# Patient Record
Sex: Female | Born: 1945 | Race: White | Hispanic: No | Marital: Married | Smoking: Never smoker
Health system: Southern US, Community
[De-identification: ages and names within clinical notes are randomized; demographics above are authoritative.]

## PROBLEM LIST (undated history)

## (undated) DIAGNOSIS — Z87442 Personal history of urinary calculi: Secondary | ICD-10-CM

## (undated) DIAGNOSIS — M1711 Unilateral primary osteoarthritis, right knee: Secondary | ICD-10-CM

## (undated) DIAGNOSIS — E039 Hypothyroidism, unspecified: Secondary | ICD-10-CM

## (undated) DIAGNOSIS — K219 Gastro-esophageal reflux disease without esophagitis: Secondary | ICD-10-CM

## (undated) DIAGNOSIS — I10 Essential (primary) hypertension: Secondary | ICD-10-CM

## (undated) HISTORY — PX: ABDOMINAL HYSTERECTOMY: SHX81

---

## 1999-04-24 ENCOUNTER — Other Ambulatory Visit: Admission: RE | Admit: 1999-04-24 | Discharge: 1999-04-24 | Payer: Self-pay | Admitting: Obstetrics and Gynecology

## 2001-03-29 ENCOUNTER — Other Ambulatory Visit: Admission: RE | Admit: 2001-03-29 | Discharge: 2001-03-29 | Payer: Self-pay | Admitting: Obstetrics and Gynecology

## 2008-08-21 ENCOUNTER — Encounter: Admission: RE | Admit: 2008-08-21 | Discharge: 2008-08-21 | Payer: Self-pay | Admitting: Family Medicine

## 2012-04-24 ENCOUNTER — Encounter (HOSPITAL_BASED_OUTPATIENT_CLINIC_OR_DEPARTMENT_OTHER): Payer: Self-pay

## 2012-04-24 ENCOUNTER — Emergency Department (HOSPITAL_BASED_OUTPATIENT_CLINIC_OR_DEPARTMENT_OTHER)
Admission: EM | Admit: 2012-04-24 | Discharge: 2012-04-24 | Disposition: A | Discharge status: Home / Self Care | Payer: Medicare Other | Attending: Emergency Medicine | Admitting: Emergency Medicine

## 2012-04-24 DIAGNOSIS — W268XXA Contact with other sharp object(s), not elsewhere classified, initial encounter: Secondary | ICD-10-CM | POA: Insufficient documentation

## 2012-04-24 DIAGNOSIS — S61209A Unspecified open wound of unspecified finger without damage to nail, initial encounter: Secondary | ICD-10-CM | POA: Insufficient documentation

## 2012-04-24 DIAGNOSIS — I10 Essential (primary) hypertension: Secondary | ICD-10-CM | POA: Insufficient documentation

## 2012-04-24 DIAGNOSIS — S61019A Laceration without foreign body of unspecified thumb without damage to nail, initial encounter: Secondary | ICD-10-CM

## 2012-04-24 DIAGNOSIS — E119 Type 2 diabetes mellitus without complications: Secondary | ICD-10-CM | POA: Insufficient documentation

## 2012-04-24 HISTORY — DX: Essential (primary) hypertension: I10

## 2012-04-24 MED ORDER — LIDOCAINE HCL 2 % IJ SOLN
INTRAMUSCULAR | Status: AC
  Administered 2012-04-24: 18:00:00
  Filled 2012-04-24: qty 1

## 2012-04-24 MED ORDER — TETANUS-DIPHTH-ACELL PERTUSSIS 5-2.5-18.5 LF-MCG/0.5 IM SUSP
0.5000 mL | Freq: Once | INTRAMUSCULAR | Status: AC
  Administered 2012-04-24: 0.5 mL via INTRAMUSCULAR
  Filled 2012-04-24: qty 0.5

## 2012-04-24 NOTE — ED Provider Notes (Signed)
History     CSN: [id number]  Arrival date & time 04/24/12  1736   First MD Initiated Contact with Patient 04/24/12 1755      Chief Complaint  Patient presents with  . Extremity Laceration    (Consider location/radiation/quality/duration/timing/severity/associated sxs/prior treatment) HPI Comments: Pt states that she cut it on a gun clip  Patient is a 66 y.o. female presenting with skin laceration. The history is provided by the patient. No language interpreter was used.  Laceration  The incident occurred 1 to 2 hours ago. The laceration is located on the left hand. The laceration is 2 cm in size. Injury mechanism: gun clip. The pain is moderate. The pain has been constant since onset. She reports no foreign bodies present. Her tetanus status is out of date.    Past Medical History  Diagnosis Date  . Hypertension   . Diabetes mellitus     Past Surgical History  Procedure Date  . Abdominal hysterectomy     No family history on file.  History  Substance Use Topics  . Smoking status: Never Smoker   . Smokeless tobacco: Not on file  . Alcohol Use: No    OB History    Grav Para Term Preterm Abortions TAB SAB Ect Mult Living                  Review of Systems  Constitutional: Negative.   Respiratory: Negative.   Cardiovascular: Negative.   Neurological: Negative for weakness and numbness.    Allergies  Review of patient's allergies indicates not on file.  Home Medications  No current outpatient prescriptions on file.  BP 154/90  Pulse 84  Temp(Src) 98.1 F (36.7 C) (Oral)  Resp 20  Ht 5\' 5"  (1.651 m)  Wt 205 lb (92.987 kg)  BMI 34.11 kg/m2  SpO2 100%  Physical Exam  Nursing note and vitals reviewed. Constitutional: She is oriented to person, place, and time. She appears well-developed and well-nourished.  Cardiovascular: Normal rate and regular rhythm.   Pulmonary/Chest: Effort normal and breath sounds normal.  Musculoskeletal: Normal range of  motion.       Pt has full rom and no deficit noted to thumb  Neurological: She is alert and oriented to person, place, and time.  Skin:       Pt has a linear laceration to the base of the left thumb    ED Course  LACERATION REPAIR Performed by: Teressa Lower Authorized by: Teressa Lower Consent: Verbal consent obtained. Risks and benefits: risks, benefits and alternatives were discussed Consent given by: patient Patient identity confirmed: verbally with patient Time out: Immediately prior to procedure a "time out" was called to verify the correct patient, procedure, equipment, support staff and site/side marked as required. Body area: upper extremity Location details: left thumb Laceration length: 2 cm Foreign bodies: no foreign bodies Tendon involvement: none Nerve involvement: none Anesthesia: local infiltration Local anesthetic: lidocaine 2% without epinephrine Irrigation solution: saline Amount of cleaning: standard Debridement: none Degree of undermining: none Skin closure: 4-0 Prolene Number of sutures: 4 Technique: simple Approximation: close Approximation difficulty: simple Patient tolerance: Patient tolerated the procedure well with no immediate complications.   (including critical care time)  Labs Reviewed - No data to display No results found.   1. Thumb laceration       MDM  Wound closed:pt tetanus updated;pt tolerated without any problem:pt has full rom;no concern for fb        Teressa Lower, NP 04/24/12  1847 

## 2012-04-24 NOTE — Discharge Instructions (Signed)
Laceration Care, Adult A laceration is a cut that goes through all layers of the skin. The cut goes into the tissue beneath the skin. HOME CARE For stitches (sutures) or staples:  Keep the cut clean and dry.   If you have a bandage (dressing), change it at least once a day. Change the bandage if it gets wet or dirty, or as told by your doctor.   Wash the cut with soap and water 2 times a day. Rinse the cut with water. Pat it dry with a clean towel.   Put a thin layer of medicated cream on the cut as told by your doctor.   You may shower after the first 24 hours. Do not soak the cut in water until the stitches are removed.   Only take medicines as told by your doctor.   Have your stitches or staples removed as told by your doctor.  For skin adhesive strips:  Keep the cut clean and dry.   Do not get the strips wet. You may take a bath, but be careful to keep the cut dry.   If the cut gets wet, pat it dry with a clean towel.   The strips will fall off on their own. Do not remove the strips that are still stuck to the cut.  For wound glue:  You may shower or take baths. Do not soak or scrub the cut. Do not swim. Avoid heavy sweating until the glue falls off on its own. After a shower or bath, pat the cut dry with a clean towel.   Do not put medicine on your cut until the glue falls off.   If you have a bandage, do not put tape over the glue.   Avoid lots of sunlight or tanning lamps until the glue falls off. Put sunscreen on the cut for the first year to reduce your scar.   The glue will fall off on its own. Do not pick at the glue.  You may need a tetanus shot if:  You cannot remember when you had your last tetanus shot.   You have never had a tetanus shot.  If you need a tetanus shot and you choose not to have one, you may get tetanus. Sickness from tetanus can be serious. GET HELP RIGHT AWAY IF:   Your pain does not get better with medicine.   Your arm, hand, leg, or  foot loses feeling (numbness) or changes color.   Your cut is bleeding.   Your joint feels weak, or you cannot use your joint.   You have painful lumps on your body.   Your cut is red, puffy (swollen), or painful.   You have a red line on the skin near the cut.   You have yellowish-white fluid (pus) coming from the cut.   You have a fever.   You have a bad smell coming from the cut or bandage.   Your cut breaks open before or after stitches are removed.   You notice something coming out of the cut, such as wood or glass.   You cannot move a finger or toe.  MAKE SURE YOU:   Understand these instructions.   Will watch your condition.   Will get help right away if you are not doing well or get worse.  Document Released: 04/26/2008 Document Revised: 10/28/2011 Document Reviewed: 05/04/2011 ExitCare Patient Information 2012 [employer].Stitches, Staples, or Skin Adhesive Strips  Stitches (sutures), staples, and skin adhesive strips hold   the skin together as it heals. They will usually be in place for 7 days or less. HOME CARE  Wash your hands with soap and water before and after you touch your wound.   Only take medicine as told by your doctor.   Cover your wound only if your doctor told you to. Otherwise, leave it open to air.   Do not get your stitches wet or dirty. If they get dirty, dab them gently with a clean washcloth. Wet the washcloth with soapy water. Do not rub. Pat them dry gently.   Do not put medicine or medicated cream on your stitches unless your doctor told you to.   Do not take out your own stitches or staples. Skin adhesive strips will fall off by themselves.   Do not pick at the wound. Picking can cause an infection.   Do not miss your follow-up appointment.   If you have problems or questions, call your doctor.  GET HELP RIGHT AWAY IF:   You have a temperature by mouth above 102 F (38.9 C), not controlled by medicine.   You have chills.     You have redness or pain around your stitches.   There is puffiness (swelling) around your stitches.   You notice fluid (drainage) from your stitches.   There is a bad smell coming from your wound.  MAKE SURE YOU:  Understand these instructions.   Will watch your condition.   Will get help if you are not doing well or get worse.  Document Released: 09/05/2009 Document Revised: 10/28/2011 Document Reviewed: 09/05/2009 Exit[employer] Patient Information 2012 [employer]. 

## 2012-04-24 NOTE — ED Notes (Signed)
Cut left thumb on gun clip approx 4pm-lac noted bleeding controlled-gauze taped in triage

## 2012-04-26 NOTE — ED Provider Notes (Signed)
Medical screening examination/treatment/procedure(s) were performed by non-physician practitioner and as supervising physician I was immediately available for consultation/collaboration.  Kenya Shiraishi, MD 04/26/12 0815 

## 2013-04-10 ENCOUNTER — Other Ambulatory Visit: Payer: Self-pay | Admitting: Family Medicine

## 2013-04-10 DIAGNOSIS — Z1231 Encounter for screening mammogram for malignant neoplasm of breast: Secondary | ICD-10-CM

## 2013-04-10 DIAGNOSIS — E2839 Other primary ovarian failure: Secondary | ICD-10-CM

## 2013-05-14 ENCOUNTER — Ambulatory Visit: Payer: 59

## 2013-05-14 ENCOUNTER — Other Ambulatory Visit: Payer: 59

## 2013-06-12 ENCOUNTER — Ambulatory Visit
Admission: RE | Admit: 2013-06-12 | Discharge: 2013-06-12 | Disposition: A | Discharge status: Home / Self Care | Payer: Medicare Other | Source: Ambulatory Visit | Attending: Family Medicine | Admitting: Family Medicine

## 2013-06-12 DIAGNOSIS — E2839 Other primary ovarian failure: Secondary | ICD-10-CM

## 2013-06-12 DIAGNOSIS — Z1231 Encounter for screening mammogram for malignant neoplasm of breast: Secondary | ICD-10-CM

## 2016-02-18 DIAGNOSIS — L84 Corns and callosities: Secondary | ICD-10-CM | POA: Insufficient documentation

## 2016-06-10 DIAGNOSIS — E2839 Other primary ovarian failure: Secondary | ICD-10-CM | POA: Insufficient documentation

## 2016-06-10 DIAGNOSIS — E781 Pure hyperglyceridemia: Secondary | ICD-10-CM | POA: Insufficient documentation

## 2016-06-10 DIAGNOSIS — K219 Gastro-esophageal reflux disease without esophagitis: Secondary | ICD-10-CM | POA: Insufficient documentation

## 2016-06-10 DIAGNOSIS — M810 Age-related osteoporosis without current pathological fracture: Secondary | ICD-10-CM | POA: Insufficient documentation

## 2016-06-29 ENCOUNTER — Other Ambulatory Visit: Payer: Self-pay | Admitting: Family Medicine

## 2016-06-29 DIAGNOSIS — M810 Age-related osteoporosis without current pathological fracture: Secondary | ICD-10-CM

## 2016-06-29 DIAGNOSIS — Z1231 Encounter for screening mammogram for malignant neoplasm of breast: Secondary | ICD-10-CM

## 2016-07-21 ENCOUNTER — Ambulatory Visit
Admission: RE | Admit: 2016-07-21 | Discharge: 2016-07-21 | Disposition: A | Discharge status: Home / Self Care | Payer: Medicare Other | Source: Ambulatory Visit | Attending: Family Medicine | Admitting: Family Medicine

## 2016-07-21 ENCOUNTER — Ambulatory Visit: Payer: Medicare Other

## 2016-07-21 DIAGNOSIS — M810 Age-related osteoporosis without current pathological fracture: Secondary | ICD-10-CM

## 2016-07-21 DIAGNOSIS — Z1231 Encounter for screening mammogram for malignant neoplasm of breast: Secondary | ICD-10-CM

## 2017-11-24 ENCOUNTER — Other Ambulatory Visit: Payer: Self-pay | Admitting: Orthopedic Surgery

## 2017-11-24 DIAGNOSIS — M1990 Unspecified osteoarthritis, unspecified site: Secondary | ICD-10-CM

## 2017-11-24 DIAGNOSIS — M5410 Radiculopathy, site unspecified: Secondary | ICD-10-CM

## 2017-11-28 ENCOUNTER — Ambulatory Visit
Admission: RE | Admit: 2017-11-28 | Discharge: 2017-11-28 | Disposition: A | Discharge status: Home / Self Care | Payer: Medicare Other | Source: Ambulatory Visit | Attending: Orthopedic Surgery | Admitting: Orthopedic Surgery

## 2017-11-28 DIAGNOSIS — M1990 Unspecified osteoarthritis, unspecified site: Secondary | ICD-10-CM

## 2017-11-28 DIAGNOSIS — M5410 Radiculopathy, site unspecified: Secondary | ICD-10-CM

## 2018-03-08 ENCOUNTER — Encounter: Payer: Self-pay | Admitting: Physician Assistant

## 2018-03-08 DIAGNOSIS — I10 Essential (primary) hypertension: Secondary | ICD-10-CM | POA: Diagnosis present

## 2018-03-08 DIAGNOSIS — E119 Type 2 diabetes mellitus without complications: Secondary | ICD-10-CM

## 2018-03-08 DIAGNOSIS — M1711 Unilateral primary osteoarthritis, right knee: Secondary | ICD-10-CM | POA: Diagnosis present

## 2018-03-08 HISTORY — DX: Unilateral primary osteoarthritis, right knee: M17.11

## 2018-03-08 NOTE — Pre-Procedure Instructions (Signed)
Nicole [name]  03/08/2018      [id number] - [place], [place] - [address] [place] [address] Phone: [phone] Fax: [phone]    Your procedure is scheduled on Monday, April 29th   Report to [facility] Admitting at 8:00 AM             (posted surgery time 10a - 12:20p)   Call this number if you have problems the morning of surgery:  [phone]   Remember:               4-5 days prior to surgery, STOP TAKING any Vitamins, Herbal Supplements, Anti-inflammatories, Blood thinners.   Do not eat food or drink liquids after midnight, Sunday.   Take these medicines the morning of surgery with A SIP OF WATER : Atenolol.   Do not wear jewelry, make-up or nail polish.  Do not wear lotions, powders,  perfumes, or deodorant.   Do not shave 48 hours prior to surgery.    Do not bring valuables to the hospital.  [facility] is not responsible for any belongings or valuables.  Contacts, dentures or bridgework may not be worn into surgery.  Leave your suitcase in the car.  After surgery it may be brought to your room.  For patients admitted to the hospital, discharge time will be determined by your treatment team.  Please read over the following fact sheets that you were given. Pain Booklet, MRSA Information and Surgical Site Infection Prevention      [place]- Preparing For Surgery  Before surgery, you can play an important role. Because skin is not sterile, your skin needs to be as free of germs as possible. You can reduce the number of germs on your skin by washing with CHG (chlorahexidine gluconate) Soap before surgery.  CHG is an antiseptic cleaner which kills germs and bonds with the skin to continue killing germs even after washing.  Please do not use if you have an allergy to CHG or antibacterial soaps. If your skin becomes reddened/irritated stop using the CHG.  Do not shave  (including legs and underarms) for at least 48 hours prior to first CHG shower. It is OK to shave your face.  Please follow these instructions carefully.   1. Shower the NIGHT BEFORE SURGERY and the MORNING OF SURGERY with CHG.   2. If you chose to wash your hair, wash your hair first as usual with your normal shampoo.  3. After you shampoo, rinse your hair and body thoroughly to remove the shampoo.  4. Use CHG as you would any other liquid soap. You can apply CHG directly to the skin and wash gently with a scrungie or a clean washcloth.   5. Apply the CHG Soap to your body ONLY FROM THE NECK DOWN.  Do not use on open wounds or open sores. Avoid contact with your eyes, ears, mouth and genitals (private parts). Wash Face and genitals (private parts)  with your normal soap.  6. Wash thoroughly, paying special attention to the area where your surgery will be performed.  7. Thoroughly rinse your body with warm water from the neck down.  8. DO NOT shower/wash with your normal soap after using and rinsing off the CHG Soap.  9. Pat yourself dry with a CLEAN TOWEL.  10. Wear CLEAN PAJAMAS to bed the night before surgery, wear comfortable clothes the morning  of surgery  11. Place CLEAN SHEETS on your bed the night of your first shower and DO NOT SLEEP WITH PETS.  Day of Surgery: Do not apply any deodorants/lotions. Please wear clean clothes to the hospital/surgery center.

## 2018-03-09 ENCOUNTER — Encounter (HOSPITAL_COMMUNITY)
Admission: RE | Admit: 2018-03-09 | Discharge: 2018-03-09 | Disposition: A | Discharge status: Home / Self Care | Payer: Medicare Other | Source: Ambulatory Visit | Attending: Orthopedic Surgery | Admitting: Orthopedic Surgery

## 2018-03-09 ENCOUNTER — Other Ambulatory Visit: Payer: Self-pay

## 2018-03-09 ENCOUNTER — Encounter (HOSPITAL_COMMUNITY): Payer: Self-pay

## 2018-03-09 DIAGNOSIS — E119 Type 2 diabetes mellitus without complications: Secondary | ICD-10-CM | POA: Diagnosis not present

## 2018-03-09 DIAGNOSIS — I10 Essential (primary) hypertension: Secondary | ICD-10-CM | POA: Diagnosis not present

## 2018-03-09 DIAGNOSIS — Z01812 Encounter for preprocedural laboratory examination: Secondary | ICD-10-CM | POA: Insufficient documentation

## 2018-03-09 DIAGNOSIS — Z01810 Encounter for preprocedural cardiovascular examination: Secondary | ICD-10-CM | POA: Diagnosis present

## 2018-03-09 LAB — COMPREHENSIVE METABOLIC PANEL
ALT: 29 U/L (ref 14–54)
AST: 25 U/L (ref 15–41)
Albumin: 3.9 g/dL (ref 3.5–5.0)
Alkaline Phosphatase: 40 U/L (ref 38–126)
Anion gap: 8 (ref 5–15)
BUN: 17 mg/dL (ref 6–20)
CO2: 28 mmol/L (ref 22–32)
Calcium: 11.4 mg/dL — ABNORMAL HIGH (ref 8.9–10.3)
Chloride: 103 mmol/L (ref 101–111)
Creatinine, Ser: 0.65 mg/dL (ref 0.44–1.00)
GFR calc Af Amer: >60 mL/min (ref >60)
GFR calc non Af Amer: >60 mL/min (ref >60)
Glucose, Bld: 110 mg/dL — ABNORMAL HIGH (ref 65–99)
Potassium: 3.3 mmol/L — ABNORMAL LOW (ref 3.5–5.1)
Sodium: 139 mmol/L (ref 135–145)
Total Bilirubin: 0.7 mg/dL (ref 0.3–1.2)
Total Protein: 6.6 g/dL (ref 6.5–8.1)

## 2018-03-09 LAB — CBC WITH DIFFERENTIAL/PLATELET
Basophils Absolute: 0 10*3/uL (ref 0.0–0.1)
Basophils Relative: 0 %
Eosinophils Absolute: 0.1 10*3/uL (ref 0.0–0.7)
Eosinophils Relative: 1 %
HCT: 42.9 % (ref 36.0–46.0)
Hemoglobin: 13.6 g/dL (ref 12.0–15.0)
Lymphocytes Relative: 46 %
Lymphs Abs: 4 10*3/uL (ref 0.7–4.0)
MCH: 30.8 pg (ref 26.0–34.0)
MCHC: 31.7 g/dL (ref 30.0–36.0)
MCV: 97.3 fL (ref 78.0–100.0)
Monocytes Absolute: 0.6 10*3/uL (ref 0.1–1.0)
Monocytes Relative: 7 %
Neutro Abs: 4 10*3/uL (ref 1.7–7.7)
Neutrophils Relative %: 46 %
Platelets: 234 10*3/uL (ref 150–400)
RBC: 4.41 MIL/uL (ref 3.87–5.11)
RDW: 13.6 % (ref 11.5–15.5)
WBC: 8.8 10*3/uL (ref 4.0–10.5)

## 2018-03-09 LAB — APTT: aPTT: 27 seconds (ref 24–36)

## 2018-03-09 LAB — HEMOGLOBIN A1C
Hgb A1c MFr Bld: 6.3 % — ABNORMAL HIGH (ref 4.8–5.6)
Mean Plasma Glucose: 134.11 mg/dL

## 2018-03-09 LAB — PROTIME-INR
INR: 0.93
Prothrombin Time: 12.4 seconds (ref 11.4–15.2)

## 2018-03-09 LAB — GLUCOSE, CAPILLARY: Glucose-Capillary: 124 mg/dL — ABNORMAL HIGH (ref 65–99)

## 2018-03-09 LAB — SURGICAL PCR SCREEN
MRSA, PCR: NEGATIVE
Staphylococcus aureus: POSITIVE — AB

## 2018-03-09 NOTE — Progress Notes (Addendum)
PCP is Bing Matter, [place]   [facility].  LOV 11/2017 Pt denies any cardiac issues.  No tests, no cp, murmur. She checks her blood sugars once a day.  Ranges from 120-150.  Last A1C 6.8 back in 11/2017

## 2018-03-10 LAB — URINE CULTURE: Culture: <10000 — AB

## 2018-03-17 ENCOUNTER — Other Ambulatory Visit: Payer: Self-pay | Admitting: Orthopedic Surgery

## 2018-03-17 MED ORDER — TRANEXAMIC ACID 1000 MG/10ML IV SOLN
1000.0000 mg | INTRAVENOUS | Status: AC
  Administered 2018-03-20: 1000 mg via INTRAVENOUS
  Filled 2018-03-17: qty 1100

## 2018-03-17 MED ORDER — BUPIVACAINE LIPOSOME 1.3 % IJ SUSP
20.0000 mL | Freq: Once | INTRAMUSCULAR | Status: DC
  Filled 2018-03-17: qty 20

## 2018-03-17 MED ORDER — CEFAZOLIN SODIUM-DEXTROSE 2-4 GM/100ML-% IV SOLN
2.0000 g | INTRAVENOUS | Status: AC
  Administered 2018-03-20: 2 g via INTRAVENOUS
  Filled 2018-03-17: qty 100

## 2018-03-17 NOTE — Care Plan (Signed)
I have met with patient in the office. She plans to discharge to home with family and HHPT provided by [facility]. She has all needed equipment at home. She is to start OPPT at [facility] on 04/03/18 at 1100 and follow up with Dr Noemi Chapel in the office after that at 230. She is aware and agreeable with this plan.   Please contact Ladell Heads, [place] with questions or if this plan needs to be changed    Thanks

## 2018-03-19 ENCOUNTER — Encounter (HOSPITAL_COMMUNITY): Payer: Self-pay | Admitting: Anesthesiology

## 2018-03-19 NOTE — Anesthesia Preprocedure Evaluation (Addendum)
Anesthesia Evaluation  Patient identified by MRN, date of birth, ID band Patient awake    Reviewed: Allergy & Precautions, NPO status , Patient's Chart, lab work & pertinent test results  Airway Mallampati: II  TM Distance: >3 FB Neck ROM: Full    Dental  (+) Teeth Intact, Dental Advisory Given   Pulmonary neg pulmonary ROS,    breath sounds clear to auscultation       Cardiovascular hypertension, Pt. on home beta blockers and Pt. on medications  Rhythm:Regular Rate:Normal     Neuro/Psych negative neurological ROS  negative psych ROS   GI/Hepatic negative GI ROS, Neg liver ROS,   Endo/Other  diabetes  Renal/GU negative Renal ROS     Musculoskeletal  (+) Arthritis ,   Abdominal Normal abdominal exam  (+)   Peds  Hematology negative hematology ROS (+)   Anesthesia Other Findings   Reproductive/Obstetrics                            Lab Results  Component Value Date   WBC 8.8 03/09/2018   HGB 13.6 03/09/2018   HCT 42.9 03/09/2018   MCV 97.3 03/09/2018   PLT 234 03/09/2018   Lab Results  Component Value Date   CREATININE 0.65 03/09/2018   BUN 17 03/09/2018   NA 139 03/09/2018   K 3.3 (L) 03/09/2018   CL 103 03/09/2018   CO2 28 03/09/2018   Lab Results  Component Value Date   INR 0.93 03/09/2018     Anesthesia Physical Anesthesia Plan  ASA: II  Anesthesia Plan: Spinal   Post-op Pain Management:  Regional for Post-op pain   Induction: Intravenous  PONV Risk Score and Plan: 3 and Ondansetron, Dexamethasone and Propofol infusion  Airway Management Planned: Natural Airway  Additional Equipment: None  Intra-op Plan:   Post-operative Plan:   Informed Consent: I have reviewed the patients History and Physical, chart, labs and discussed the procedure including the risks, benefits and alternatives for the proposed anesthesia with the patient or authorized representative  who has indicated his/her understanding and acceptance.   Dental advisory given  Plan Discussed with: CRNA  Anesthesia Plan Comments:        Anesthesia Quick Evaluation

## 2018-03-20 ENCOUNTER — Inpatient Hospital Stay (HOSPITAL_COMMUNITY): Payer: Medicare Other | Admitting: Anesthesiology

## 2018-03-20 ENCOUNTER — Encounter (HOSPITAL_COMMUNITY): Payer: Self-pay | Admitting: Certified Registered Nurse Anesthetist

## 2018-03-20 ENCOUNTER — Inpatient Hospital Stay (HOSPITAL_COMMUNITY)
Admission: RE | Admit: 2018-03-20 | Discharge: 2018-03-22 | DRG: 470 | Disposition: A | Discharge status: Home Health | Payer: Medicare Other | Source: Ambulatory Visit | Attending: Orthopedic Surgery | Admitting: Orthopedic Surgery

## 2018-03-20 ENCOUNTER — Inpatient Hospital Stay (HOSPITAL_COMMUNITY): Payer: Medicare Other | Admitting: Vascular Surgery

## 2018-03-20 ENCOUNTER — Encounter (HOSPITAL_COMMUNITY)
Admission: RE | Disposition: A | Discharge status: Home Health | Payer: Self-pay | Source: Ambulatory Visit | Attending: Orthopedic Surgery

## 2018-03-20 ENCOUNTER — Other Ambulatory Visit: Payer: Self-pay

## 2018-03-20 DIAGNOSIS — I959 Hypotension, unspecified: Secondary | ICD-10-CM | POA: Diagnosis present

## 2018-03-20 DIAGNOSIS — I10 Essential (primary) hypertension: Secondary | ICD-10-CM | POA: Diagnosis present

## 2018-03-20 DIAGNOSIS — E119 Type 2 diabetes mellitus without complications: Secondary | ICD-10-CM

## 2018-03-20 DIAGNOSIS — M1711 Unilateral primary osteoarthritis, right knee: Principal | ICD-10-CM | POA: Diagnosis present

## 2018-03-20 HISTORY — DX: Unilateral primary osteoarthritis, right knee: M17.11

## 2018-03-20 HISTORY — PX: TOTAL KNEE ARTHROPLASTY: SHX125

## 2018-03-20 LAB — GLUCOSE, CAPILLARY
Glucose-Capillary: 147 mg/dL — ABNORMAL HIGH (ref 65–99)
Glucose-Capillary: 125 mg/dL — ABNORMAL HIGH (ref 65–99)

## 2018-03-20 SURGERY — ARTHROPLASTY, KNEE, TOTAL
Anesthesia: Spinal | Site: Knee | Laterality: Right

## 2018-03-20 MED ORDER — GLYCOPYRROLATE 0.2 MG/ML IV SOSY
PREFILLED_SYRINGE | INTRAVENOUS | Status: DC | PRN
  Administered 2018-03-20: 0.4 mg via INTRAVENOUS

## 2018-03-20 MED ORDER — DOCUSATE SODIUM 100 MG PO CAPS
100.0000 mg | ORAL_CAPSULE | Freq: Two times a day (BID) | ORAL | Status: DC
  Administered 2018-03-21 – 2018-03-22 (×3): 100 mg via ORAL
  Filled 2018-03-20 (×3): qty 1

## 2018-03-20 MED ORDER — BUPIVACAINE-EPINEPHRINE (PF) 0.5% -1:200000 IJ SOLN
INTRAMUSCULAR | Status: AC
  Filled 2018-03-20: qty 30

## 2018-03-20 MED ORDER — CHLORHEXIDINE GLUCONATE 4 % EX LIQD: 60.0000 mL | Freq: Once | CUTANEOUS | Status: DC

## 2018-03-20 MED ORDER — BUPIVACAINE IN DEXTROSE 0.75-8.25 % IT SOLN
INTRATHECAL | Status: DC | PRN
  Administered 2018-03-20: 11 mg via INTRATHECAL

## 2018-03-20 MED ORDER — CEFAZOLIN SODIUM-DEXTROSE 2-4 GM/100ML-% IV SOLN
2.0000 g | Freq: Four times a day (QID) | INTRAVENOUS | Status: AC
  Administered 2018-03-20 (×2): 2 g via INTRAVENOUS
  Filled 2018-03-20 (×2): qty 100

## 2018-03-20 MED ORDER — ONDANSETRON HCL 4 MG/2ML IJ SOLN
4.0000 mg | Freq: Four times a day (QID) | INTRAMUSCULAR | Status: DC | PRN
  Administered 2018-03-21: 4 mg via INTRAVENOUS
  Filled 2018-03-20: qty 2

## 2018-03-20 MED ORDER — HYDROMORPHONE HCL 1 MG/ML IJ SOLN: 0.5000 mg | INTRAMUSCULAR | Status: DC | PRN

## 2018-03-20 MED ORDER — FENTANYL CITRATE (PF) 250 MCG/5ML IJ SOLN
INTRAMUSCULAR | Status: AC
  Filled 2018-03-20: qty 5

## 2018-03-20 MED ORDER — PROPOFOL 10 MG/ML IV BOLUS
INTRAVENOUS | Status: DC | PRN
  Administered 2018-03-20: 10 mg via INTRAVENOUS

## 2018-03-20 MED ORDER — DEXTROSE 5 % IV SOLN
INTRAVENOUS | Status: DC | PRN
  Administered 2018-03-20: 20 ug/min via INTRAVENOUS

## 2018-03-20 MED ORDER — CEFUROXIME SODIUM 1.5 G IV SOLR
INTRAVENOUS | Status: DC | PRN
  Administered 2018-03-20: 1.5 g via INTRAVENOUS

## 2018-03-20 MED ORDER — LACTATED RINGERS IV SOLN: INTRAVENOUS | Status: DC

## 2018-03-20 MED ORDER — BUPIVACAINE LIPOSOME 1.3 % IJ SUSP
INTRAMUSCULAR | Status: DC | PRN
  Administered 2018-03-20: 20 mL

## 2018-03-20 MED ORDER — DEXAMETHASONE SODIUM PHOSPHATE 10 MG/ML IJ SOLN
10.0000 mg | Freq: Three times a day (TID) | INTRAMUSCULAR | Status: AC
  Administered 2018-03-20 – 2018-03-21 (×4): 10 mg via INTRAVENOUS
  Filled 2018-03-20 (×4): qty 1

## 2018-03-20 MED ORDER — 0.9 % SODIUM CHLORIDE (POUR BTL) OPTIME
TOPICAL | Status: DC | PRN
  Administered 2018-03-20: 1000 mL

## 2018-03-20 MED ORDER — ONDANSETRON HCL 4 MG PO TABS: 4.0000 mg | ORAL_TABLET | Freq: Four times a day (QID) | ORAL | Status: DC | PRN

## 2018-03-20 MED ORDER — HYDROMORPHONE HCL 2 MG/ML IJ SOLN
0.5000 mg | INTRAMUSCULAR | Status: DC | PRN
  Administered 2018-03-21: 0.5 mg via INTRAVENOUS
  Administered 2018-03-21: 1 mg via INTRAVENOUS
  Filled 2018-03-20 (×2): qty 1

## 2018-03-20 MED ORDER — POVIDONE-IODINE 7.5 % EX SOLN
Freq: Once | CUTANEOUS | Status: DC
  Filled 2018-03-20: qty 118

## 2018-03-20 MED ORDER — CEFUROXIME SODIUM 750 MG IJ SOLR
INTRAMUSCULAR | Status: AC
  Filled 2018-03-20: qty 750

## 2018-03-20 MED ORDER — SODIUM CHLORIDE 0.9 % IR SOLN
Status: DC | PRN
  Administered 2018-03-20: 3000 mL

## 2018-03-20 MED ORDER — ASPIRIN EC 325 MG PO TBEC
325.0000 mg | DELAYED_RELEASE_TABLET | Freq: Every day | ORAL | Status: DC
  Administered 2018-03-21 – 2018-03-22 (×2): 325 mg via ORAL
  Filled 2018-03-20 (×2): qty 1

## 2018-03-20 MED ORDER — SODIUM CHLORIDE 0.9 % IJ SOLN
INTRAMUSCULAR | Status: DC | PRN
  Administered 2018-03-20: 50 mL

## 2018-03-20 MED ORDER — FENTANYL CITRATE (PF) 100 MCG/2ML IJ SOLN
50.0000 ug | Freq: Once | INTRAMUSCULAR | Status: AC
  Administered 2018-03-20: 50 ug via INTRAVENOUS

## 2018-03-20 MED ORDER — MIDAZOLAM HCL 2 MG/2ML IJ SOLN
2.0000 mg | Freq: Once | INTRAMUSCULAR | Status: AC
  Administered 2018-03-20: 2 mg via INTRAVENOUS

## 2018-03-20 MED ORDER — PROPOFOL 500 MG/50ML IV EMUL
INTRAVENOUS | Status: DC | PRN
  Administered 2018-03-20: 75 ug/kg/min via INTRAVENOUS

## 2018-03-20 MED ORDER — PHENOL 1.4 % MT LIQD: 1.0000 | OROMUCOSAL | Status: DC | PRN

## 2018-03-20 MED ORDER — ACETAMINOPHEN 500 MG PO TABS
1000.0000 mg | ORAL_TABLET | Freq: Four times a day (QID) | ORAL | Status: AC
  Administered 2018-03-20 – 2018-03-21 (×3): 1000 mg via ORAL
  Filled 2018-03-20 (×3): qty 2

## 2018-03-20 MED ORDER — BUPIVACAINE-EPINEPHRINE 0.5% -1:200000 IJ SOLN
INTRAMUSCULAR | Status: DC | PRN
  Administered 2018-03-20: 50 mL

## 2018-03-20 MED ORDER — GABAPENTIN 300 MG PO CAPS
300.0000 mg | ORAL_CAPSULE | Freq: Every day | ORAL | Status: DC
  Administered 2018-03-20 – 2018-03-21 (×2): 300 mg via ORAL
  Filled 2018-03-20 (×2): qty 1

## 2018-03-20 MED ORDER — FENTANYL CITRATE (PF) 100 MCG/2ML IJ SOLN
INTRAMUSCULAR | Status: DC | PRN
  Administered 2018-03-20: 50 ug via INTRAVENOUS

## 2018-03-20 MED ORDER — ALUM & MAG HYDROXIDE-SIMETH 200-200-20 MG/5ML PO SUSP: 30.0000 mL | ORAL | Status: DC | PRN

## 2018-03-20 MED ORDER — POLYETHYLENE GLYCOL 3350 17 G PO PACK
17.0000 g | PACK | Freq: Two times a day (BID) | ORAL | Status: DC
  Administered 2018-03-21 – 2018-03-22 (×2): 17 g via ORAL
  Filled 2018-03-20 (×3): qty 1

## 2018-03-20 MED ORDER — DIPHENHYDRAMINE HCL 12.5 MG/5ML PO ELIX: 12.5000 mg | ORAL_SOLUTION | ORAL | Status: DC | PRN

## 2018-03-20 MED ORDER — POTASSIUM CHLORIDE IN NACL 20-0.9 MEQ/L-% IV SOLN
INTRAVENOUS | Status: DC
  Administered 2018-03-20: 15:00:00 via INTRAVENOUS
  Filled 2018-03-20: qty 1000

## 2018-03-20 MED ORDER — MENTHOL 3 MG MT LOZG: 1.0000 | LOZENGE | OROMUCOSAL | Status: DC | PRN

## 2018-03-20 MED ORDER — METOCLOPRAMIDE HCL 5 MG/ML IJ SOLN: 5.0000 mg | Freq: Three times a day (TID) | INTRAMUSCULAR | Status: DC | PRN

## 2018-03-20 MED ORDER — MEPERIDINE HCL 50 MG/ML IJ SOLN: 6.2500 mg | INTRAMUSCULAR | Status: DC | PRN

## 2018-03-20 MED ORDER — POLYMYXIN B-TRIMETHOPRIM 10000-0.1 UNIT/ML-% OP SOLN
1.0000 [drp] | OPHTHALMIC | Status: DC
  Administered 2018-03-20 – 2018-03-22 (×9): 1 [drp] via OPHTHALMIC
  Filled 2018-03-20: qty 10

## 2018-03-20 MED ORDER — OXYCODONE HCL 5 MG PO TABS
ORAL_TABLET | ORAL | Status: AC
  Filled 2018-03-20: qty 1

## 2018-03-20 MED ORDER — METOCLOPRAMIDE HCL 5 MG PO TABS: 5.0000 mg | ORAL_TABLET | Freq: Three times a day (TID) | ORAL | Status: DC | PRN

## 2018-03-20 MED ORDER — KETOROLAC TROMETHAMINE 0.5 % OP SOLN
1.0000 [drp] | Freq: Three times a day (TID) | OPHTHALMIC | Status: DC | PRN
  Administered 2018-03-20: 1 [drp] via OPHTHALMIC
  Filled 2018-03-20 (×2): qty 3

## 2018-03-20 MED ORDER — MIDAZOLAM HCL 2 MG/2ML IJ SOLN
INTRAMUSCULAR | Status: AC
  Filled 2018-03-20: qty 2

## 2018-03-20 MED ORDER — ONDANSETRON HCL 4 MG/2ML IJ SOLN
INTRAMUSCULAR | Status: DC | PRN
  Administered 2018-03-20: 4 mg via INTRAVENOUS

## 2018-03-20 MED ORDER — PROPOFOL 10 MG/ML IV BOLUS
INTRAVENOUS | Status: AC
  Filled 2018-03-20: qty 20

## 2018-03-20 MED ORDER — ROPIVACAINE HCL 7.5 MG/ML IJ SOLN
INTRAMUSCULAR | Status: DC | PRN
  Administered 2018-03-20: 20 mL via PERINEURAL

## 2018-03-20 MED ORDER — ATENOLOL 25 MG PO TABS
25.0000 mg | ORAL_TABLET | Freq: Every day | ORAL | Status: DC
  Administered 2018-03-21 – 2018-03-22 (×2): 25 mg via ORAL
  Filled 2018-03-20 (×2): qty 1

## 2018-03-20 MED ORDER — FENTANYL CITRATE (PF) 100 MCG/2ML IJ SOLN
INTRAMUSCULAR | Status: AC
  Administered 2018-03-20: 50 ug via INTRAVENOUS
  Filled 2018-03-20: qty 2

## 2018-03-20 MED ORDER — OXYCODONE HCL 5 MG PO TABS
5.0000 mg | ORAL_TABLET | ORAL | Status: DC | PRN
  Administered 2018-03-20 – 2018-03-21 (×4): 5 mg via ORAL
  Administered 2018-03-21: 10 mg via ORAL
  Administered 2018-03-21: 5 mg via ORAL
  Administered 2018-03-22 (×2): 10 mg via ORAL
  Filled 2018-03-20: qty 1
  Filled 2018-03-20 (×2): qty 2
  Filled 2018-03-20 (×2): qty 1
  Filled 2018-03-20: qty 2
  Filled 2018-03-20 (×2): qty 1

## 2018-03-20 MED ORDER — HYDROMORPHONE HCL 2 MG/ML IJ SOLN
INTRAMUSCULAR | Status: AC
  Filled 2018-03-20: qty 1

## 2018-03-20 MED ORDER — BSS IO SOLN
15.0000 mL | Freq: Once | INTRAOCULAR | Status: DC
  Filled 2018-03-20 (×2): qty 15

## 2018-03-20 MED ORDER — PROMETHAZINE HCL 25 MG/ML IJ SOLN: 6.2500 mg | INTRAMUSCULAR | Status: DC | PRN

## 2018-03-20 MED ORDER — MIDAZOLAM HCL 2 MG/2ML IJ SOLN
INTRAMUSCULAR | Status: AC
  Administered 2018-03-20: 2 mg via INTRAVENOUS
  Filled 2018-03-20: qty 2

## 2018-03-20 MED ORDER — HYDROMORPHONE HCL 2 MG/ML IJ SOLN
0.3000 mg | INTRAMUSCULAR | Status: DC | PRN
  Administered 2018-03-20: 0.5 mg via INTRAVENOUS

## 2018-03-20 MED ORDER — LACTATED RINGERS IV SOLN
INTRAVENOUS | Status: DC
  Administered 2018-03-20: 08:00:00 via INTRAVENOUS

## 2018-03-20 MED ORDER — DEXAMETHASONE SODIUM PHOSPHATE 10 MG/ML IJ SOLN
INTRAMUSCULAR | Status: DC | PRN
  Administered 2018-03-20: 10 mg via INTRAVENOUS

## 2018-03-20 SURGICAL SUPPLY — 65 items
BANDAGE ESMARK 6X9 LF (GAUZE/BANDAGES/DRESSINGS) ×1 IMPLANT
BENZOIN TINCTURE PRP APPL 2/3 (GAUZE/BANDAGES/DRESSINGS) ×2 IMPLANT
BLADE SAGITTAL 25.0X1.19X90 (BLADE) ×2 IMPLANT
BLADE SAW SGTL 13X75X1.27 (BLADE) ×2 IMPLANT
BLADE SURG 10 STRL SS (BLADE) ×4 IMPLANT
BNDG ELASTIC 6X15 VLCR STRL LF (GAUZE/BANDAGES/DRESSINGS) ×2 IMPLANT
BNDG ESMARK 6X9 LF (GAUZE/BANDAGES/DRESSINGS) ×2
BOWL SMART MIX CTS (DISPOSABLE) ×2 IMPLANT
CAPT KNEE TOTAL 3 ATTUNE ×2 IMPLANT
CEMENT HV SMART SET (Cement) ×4 IMPLANT
COVER SURGICAL LIGHT HANDLE (MISCELLANEOUS) ×2 IMPLANT
CUFF TOURNIQUET SINGLE 34IN LL (TOURNIQUET CUFF) ×2 IMPLANT
CUFF TOURNIQUET SINGLE 44IN (TOURNIQUET CUFF) IMPLANT
DECANTER SPIKE VIAL GLASS SM (MISCELLANEOUS) ×2 IMPLANT
DRAPE EXTREMITY T 121X128X90 (DRAPE) ×2 IMPLANT
DRAPE HALF SHEET 40X57 (DRAPES) ×4 IMPLANT
DRAPE INCISE IOBAN 66X45 STRL (DRAPES) IMPLANT
DRAPE ORTHO SPLIT 77X108 STRL (DRAPES) ×1
DRAPE SURG ORHT 6 SPLT 77X108 (DRAPES) ×1 IMPLANT
DRAPE U-SHAPE 47X51 STRL (DRAPES) ×2 IMPLANT
DRSG AQUACEL AG ADV 3.5X10 (GAUZE/BANDAGES/DRESSINGS) ×2 IMPLANT
DURAPREP 26ML APPLICATOR (WOUND CARE) ×2 IMPLANT
ELECT CAUTERY BLADE 6.4 (BLADE) ×2 IMPLANT
ELECT REM PT RETURN 9FT ADLT (ELECTROSURGICAL) ×2
ELECTRODE REM PT RTRN 9FT ADLT (ELECTROSURGICAL) ×1 IMPLANT
FACESHIELD WRAPAROUND (MASK) ×2 IMPLANT
GLOVE BIO SURGEON STRL SZ7 (GLOVE) ×2 IMPLANT
GLOVE BIOGEL PI IND STRL 7.0 (GLOVE) ×1 IMPLANT
GLOVE BIOGEL PI IND STRL 7.5 (GLOVE) ×1 IMPLANT
GLOVE BIOGEL PI INDICATOR 7.0 (GLOVE) ×1
GLOVE BIOGEL PI INDICATOR 7.5 (GLOVE) ×1
GLOVE SS BIOGEL STRL SZ 7.5 (GLOVE) ×1 IMPLANT
GLOVE SUPERSENSE BIOGEL SZ 7.5 (GLOVE) ×1
GOWN STRL REUS W/ TWL LRG LVL3 (GOWN DISPOSABLE) ×1 IMPLANT
GOWN STRL REUS W/ TWL XL LVL3 (GOWN DISPOSABLE) ×1 IMPLANT
GOWN STRL REUS W/TWL LRG LVL3 (GOWN DISPOSABLE) ×1
GOWN STRL REUS W/TWL XL LVL3 (GOWN DISPOSABLE) ×1
HANDPIECE INTERPULSE COAX TIP (DISPOSABLE) ×1
HOOD PEEL AWAY FACE SHEILD DIS (HOOD) ×4 IMPLANT
IMMOBILIZER KNEE 22 UNIV (SOFTGOODS) ×2 IMPLANT
KIT BASIN OR (CUSTOM PROCEDURE TRAY) ×2 IMPLANT
KIT TURNOVER KIT B (KITS) ×2 IMPLANT
MANIFOLD NEPTUNE II (INSTRUMENTS) ×2 IMPLANT
MARKER SKIN DUAL TIP RULER LAB (MISCELLANEOUS) ×2 IMPLANT
NEEDLE HYPO 22GX1.5 SAFETY (NEEDLE) ×4 IMPLANT
NS IRRIG 1000ML POUR BTL (IV SOLUTION) ×2 IMPLANT
PACK TOTAL JOINT (CUSTOM PROCEDURE TRAY) ×2 IMPLANT
PAD ARMBOARD 7.5X6 YLW CONV (MISCELLANEOUS) ×4 IMPLANT
SET HNDPC FAN SPRY TIP SCT (DISPOSABLE) ×1 IMPLANT
STRIP CLOSURE SKIN 1/2X4 (GAUZE/BANDAGES/DRESSINGS) ×2 IMPLANT
SUCTION FRAZIER HANDLE 10FR (MISCELLANEOUS) ×1
SUCTION TUBE FRAZIER 10FR DISP (MISCELLANEOUS) ×1 IMPLANT
SUT MNCRL AB 3-0 PS2 18 (SUTURE) ×2 IMPLANT
SUT VIC AB 0 CT1 27 (SUTURE) ×2
SUT VIC AB 0 CT1 27XBRD ANBCTR (SUTURE) ×2 IMPLANT
SUT VIC AB 1 CT1 27 (SUTURE) ×1
SUT VIC AB 1 CT1 27XBRD ANBCTR (SUTURE) ×1 IMPLANT
SUT VIC AB 2-0 CT1 27 (SUTURE) ×2
SUT VIC AB 2-0 CT1 TAPERPNT 27 (SUTURE) ×2 IMPLANT
SYR CONTROL 10ML LL (SYRINGE) ×4 IMPLANT
TOWEL OR 17X24 6PK STRL BLUE (TOWEL DISPOSABLE) ×2 IMPLANT
TOWEL OR 17X26 10 PK STRL BLUE (TOWEL DISPOSABLE) ×2 IMPLANT
TRAY CATH 16FR W/PLASTIC CATH (SET/KITS/TRAYS/PACK) IMPLANT
TRAY FOLEY CATH SILVER 16FR (SET/KITS/TRAYS/PACK) ×2 IMPLANT
WATER STERILE IRR 1000ML POUR (IV SOLUTION) ×2 IMPLANT

## 2018-03-20 NOTE — Evaluation (Signed)
Physical Therapy Evaluation Patient Details Name: Nicole [name] MRN: [id number] DOB: [birth year] Today's Date: 03/20/2018   History of Present Illness  Pt is a 72 y/o female s/p R TKA. PMH includes DM and HTN.   Clinical Impression  Pt is s/p surgery above with deficits below. Pt limited secondary to R knee pain so distance limited to chair. Pt requiring min guard to mod A for mobility using RW. Pt also presented with increased R eye irritation and was asking about eye patch; notified RN. Reviewed supine HEP and knee precautions. Will continue to follow acutely to maximize functional mobility independence and safety.     Follow Up Recommendations Follow surgeon's recommendation for DC plan and follow-up therapies;Supervision for mobility/OOB    Equipment Recommendations  None recommended by PT    Recommendations for Other Services OT consult     Precautions / Restrictions Precautions Precautions: Knee Precaution Booklet Issued: Yes (comment) Precaution Comments: REviewed knee precautions and supine HEP.  Required Braces or Orthoses: Knee Immobilizer - Right Knee Immobilizer - Right: Other (comment)(until discontinued ) Restrictions Weight Bearing Restrictions: Yes RLE Weight Bearing: Weight bearing as tolerated      Mobility  Bed Mobility Overal bed mobility: Needs Assistance Bed Mobility: Supine to Sit     Supine to sit: Min assist     General bed mobility comments: Min A for RLE assist to come up to sitting. Increased time required.   Transfers Overall transfer level: Needs assistance Equipment used: Rolling walker (2 wheeled) Transfers: Sit to/from Stand Sit to Stand: Mod assist         General transfer comment: Mod A for lift assist and steadying. Required 2 attempts to perform. Pt with difficulty sequencing using KI.   Ambulation/Gait Ambulation/Gait assistance: Min guard Ambulation Distance (Feet): 5 Feet Assistive device: Rolling walker (2  wheeled) Gait Pattern/deviations: Step-to pattern;Decreased step length - right;Decreased step length - left;Decreased weight shift to right;Antalgic Gait velocity: Decreased  Gait velocity interpretation: <1.31 ft/sec, indicative of household ambulator General Gait Details: Slow, antalgic gait. Distance limited to chair secondary to pain. Verbal cues for sequencing using RW.   Stairs            Wheelchair Mobility    Modified Rankin (Stroke Patients Only)       Balance Overall balance assessment: Needs assistance Sitting-balance support: No upper extremity supported;Feet supported Sitting balance-Leahy Scale: Good     Standing balance support: Bilateral upper extremity supported;During functional activity Standing balance-Leahy Scale: Poor Standing balance comment: Reliant on BUE support.                              Pertinent Vitals/Pain Pain Assessment: 0-10 Pain Score: 2  Pain Location: R knee  Pain Descriptors / Indicators: Aching;Operative site guarding Pain Intervention(s): Limited activity within patient's tolerance;Monitored during session;Repositioned    Home Living Family/patient expects to be discharged to:: Private residence Living Arrangements: Spouse/significant other Available Help at Discharge: Family;Available 24 hours/day Type of Home: House Home Access: Stairs to enter Entrance Stairs-Rails: None Entrance Stairs-Number of Steps: 2 Home Layout: One level Home Equipment: Walker - 2 wheels;Shower seat - built in;Grab bars - tub/shower      Prior Function Level of Independence: Independent               Hand Dominance   Dominant Hand: Right    Extremity/Trunk Assessment   Upper Extremity Assessment Upper Extremity Assessment: Defer to OT  evaluation    Lower Extremity Assessment Lower Extremity Assessment: RLE deficits/detail RLE Deficits / Details: Sensory in tact. Deficits consistent with post op pain and weakness.  Able to perform ther ex below.     Cervical / Trunk Assessment Cervical / Trunk Assessment: Normal  Communication   Communication: No difficulties  Cognition Arousal/Alertness: Awake/alert Behavior During Therapy: WFL for tasks assessed/performed Overall Cognitive Status: Within Functional Limits for tasks assessed                                        General Comments General comments (skin integrity, edema, etc.): Pt's R eye red and itchy. Pt with difficulty keeping it open during session secondary to irritation. RN aware. Pt and pt's daughter asking about eye patch.     Exercises Total Joint Exercises Ankle Circles/Pumps: AROM;Both;20 reps Quad Sets: AROM;Right;10 reps Heel Slides: AROM;Right;10 reps   Assessment/Plan    PT Assessment Patient needs continued PT services  PT Problem List Decreased strength;Decreased range of motion;Decreased activity tolerance;Decreased balance;Decreased mobility;Decreased knowledge of use of DME;Decreased knowledge of precautions;Pain       PT Treatment Interventions DME instruction;Gait training;Functional mobility training;Therapeutic activities;Stair training;Therapeutic exercise;Balance training;Neuromuscular re-education;Patient/family education    PT Goals (Current goals can be found in the Care Plan section)  Acute Rehab PT Goals Patient Stated Goal: to go home  PT Goal Formulation: With patient Time For Goal Achievement: 04/03/18 Potential to Achieve Goals: Good    Frequency 7X/week   Barriers to discharge        Co-evaluation               AM-PAC PT "6 Clicks" Daily Activity  Outcome Measure Difficulty turning over in bed (including adjusting bedclothes, sheets and blankets)?: A Little Difficulty moving from lying on back to sitting on the side of the bed? : Unable Difficulty sitting down on and standing up from a chair with arms (e.g., wheelchair, bedside commode, etc,.)?: Unable Help needed  moving to and from a bed to chair (including a wheelchair)?: A Little Help needed walking in hospital room?: A Little Help needed climbing 3-5 steps with a railing? : A Lot 6 Click Score: 13    End of Session Equipment Utilized During Treatment: Gait belt;Right knee immobilizer Activity Tolerance: Patient limited by pain Patient left: in chair;with call bell/phone within reach;with family/visitor present Nurse Communication: Mobility status PT Visit Diagnosis: Other abnormalities of gait and mobility (R26.89);Pain;Muscle weakness (generalized) (M62.81) Pain - Right/Left: Right Pain - part of body: Knee    Time: 1157-2620 PT Time Calculation (min) (ACUTE ONLY): 27 min   Charges:   PT Evaluation $PT Eval Moderate Complexity: 1 Mod PT Treatments $Therapeutic Activity: 8-22 mins   PT G Codes:        Leighton Ruff, PT, DPT  Acute Rehabilitation Services  Pager: [phone]   Rudean Hitt 03/20/2018, 4:13 PM

## 2018-03-20 NOTE — Anesthesia Procedure Notes (Signed)
Anesthesia Regional Block: Adductor canal block   Pre-Anesthetic Checklist: ,, timeout performed, Correct Patient, Correct Site, Correct Laterality, Correct Procedure, Correct Position, site marked, Risks and benefits discussed,  Surgical consent,  Pre-op evaluation,  At surgeon's request and post-op pain management  Laterality: Right  Prep: chloraprep       Needles:  Injection technique: Single-shot  Needle Type: Echogenic Needle     Needle Length: 9cm  Needle Gauge: 21     Additional Needles:   Procedures:,,,, ultrasound used (permanent image in chart),,,,  Narrative:  Start time: 03/20/2018 7:55 AM End time: 03/20/2018 8:05 AM Injection made incrementally with aspirations every 5 mL.  Performed by: Personally  Anesthesiologist: Effie Berkshire, MD  Additional Notes: Patient tolerated the procedure well. Local anesthetic introduced in an incremental fashion under minimal resistance after negative aspirations. No paresthesias were elicited. After completion of the procedure, no acute issues were identified and patient continued to be monitored by RN.

## 2018-03-20 NOTE — Progress Notes (Signed)
Patient arrived to PACU with reddened puffy right eye lid. States that it feels scratchy. Dr Ambrose Pancoast at bedside to assess- will apply cool compress and reassess before taking patient to room.

## 2018-03-20 NOTE — Transfer of Care (Signed)
Immediate Anesthesia Transfer of Care Note  Patient: Nicole [name]  Procedure(s) Performed: RIGHT TOTAL KNEE ARTHROPLASTY (Right Knee)  Patient Location: PACU  Anesthesia Type:Spinal and MAC combined with regional for post-op pain  Level of Consciousness: awake, alert , oriented and patient cooperative  Airway & Oxygen Therapy: Patient Spontanous Breathing  Post-op Assessment: Report given to RN, Post -op Vital signs reviewed and stable and Patient moving all extremities X 4  Post vital signs: Reviewed and stable  Last Vitals:  Vitals Value Taken Time  BP 97/61 03/20/2018 11:11 AM  Temp    Pulse 106 03/20/2018 11:14 AM  Resp 17 03/20/2018 11:14 AM  SpO2 97 % 03/20/2018 11:14 AM  Vitals shown include unvalidated device data.  Last Pain:  Vitals:   03/20/18 0805  TempSrc:   PainSc: 0-No pain         Complications: No apparent anesthesia complications   Right eye lid slightly puffy, pt complaining of a scratchy eye. Dr. Ambrose Pancoast at bedside to assess.

## 2018-03-20 NOTE — Interval H&P Note (Signed)
History and Physical Interval Note:  03/20/2018 8:54 AM  Nicole [name]  has presented today for surgery, with the diagnosis of djd right knee  The various methods of treatment have been discussed with the patient and family. After consideration of risks, benefits and other options for treatment, the patient has consented to  Procedure(s): TOTAL KNEE ARTHROPLASTY (Right) as a surgical intervention .  The patient's history has been reviewed, patient examined, no change in status, stable for surgery.  I have reviewed the patient's chart and labs.  Questions were answered to the patient's satisfaction.     Lorn Junes

## 2018-03-20 NOTE — Anesthesia Postprocedure Evaluation (Signed)
Anesthesia Post Note  Patient: Nicole [name]  Procedure(s) Performed: RIGHT TOTAL KNEE ARTHROPLASTY (Right Knee)     Patient location during evaluation: PACU Anesthesia Type: Spinal Level of consciousness: oriented and awake and alert Pain management: pain level controlled Vital Signs Assessment: post-procedure vital signs reviewed and stable Respiratory status: spontaneous breathing, respiratory function stable and patient connected to nasal cannula oxygen Cardiovascular status: blood pressure returned to baseline and stable Postop Assessment: no headache, no backache and no apparent nausea or vomiting Anesthetic complications: no    Last Vitals:  Vitals:   03/20/18 1126 03/20/18 1142  BP: (!) 92/53 106/74  Pulse: (!) 101 98  Resp: 17 15  Temp:    SpO2: 94% 98%    Last Pain:  Vitals:   03/20/18 1135  TempSrc:   PainSc: 4     LLE Motor Response: Purposeful movement (03/20/18 1140) LLE Sensation: Decreased;Tingling (03/20/18 1140) RLE Motor Response: Purposeful movement (03/20/18 1140) RLE Sensation: Decreased;Tingling (03/20/18 1140) L Sensory Level: S1-Sole of foot, small toes (03/20/18 1140) R Sensory Level: S1-Sole of foot, small toes (03/20/18 1140)  Jayne Peckenpaugh

## 2018-03-20 NOTE — Progress Notes (Signed)
Orthopedic Tech Progress Note Patient Details:  Nicole [name] 05-11-1946 [id number]  CPM Right Knee CPM Right Knee: On Right Knee Flexion (Degrees): 90 Right Knee Extension (Degrees): 0 Additional Comments: foot roll  Post Interventions Patient Tolerated: Well Instructions Provided: Care of device, Adjustment of device  Nicole [name] 03/20/2018, 11:32 AM

## 2018-03-20 NOTE — Op Note (Signed)
MRN:     [id number] DOB/AGE:    [birth year] / 72 y.o.       OPERATIVE REPORT   DATE OF PROCEDURE:  03/20/2018      PREOPERATIVE DIAGNOSIS:   Primary Localized Osteoarthritis right Knee       Estimated body mass index is 32.96 kg/m as calculated from the following:   Height as of this encounter: 5\' 4"  (1.626 m).   Weight as of this encounter: 87.1 kg (192 lb).                                                       POSTOPERATIVE DIAGNOSIS:   Same                                                                 PROCEDURE:  Procedure(s): RIGHT TOTAL KNEE ARTHROPLASTY Using Depuy Attune RP implants #5 Femur, #7Tibia, 40mm  RP bearing, 35 Patella    SURGEON: Venecia Mehl A. Noemi Chapel, MD   ASSISTANT: Matthew Saras, PA-C, present and scrubbed throughout the case, critical for retraction, instrumentation, and closure.  ANESTHESIA: Spinal with Adductor Nerve Block  TOURNIQUET TIME: 60 minutes   COMPLICATIONS:  None       SPECIMENS: None   INDICATIONS FOR PROCEDURE: The patient has djd of the knee with varus deformities, XR shows bone on bone arthritis. Patient has failed all conservative measures including anti-inflammatory medicines, narcotics, attempts at exercise and weight loss, cortisone injections and viscosupplementation.  Risks and benefits of surgery have been discussed, questions answered.    DESCRIPTION OF PROCEDURE: The patient identified by armband, received right adductor canal block and IV antibiotics, in the holding area at [facility]. Patient taken to the operating room, appropriate anesthetic monitors were attached. Spinal anesthesia induced with the patient in supine position, Foley catheter was inserted. Tourniquet applied high to the operative thigh. Lateral post and foot positioner applied to the table, the lower extremity was then prepped and draped in usual sterile fashion from the ankle to the tourniquet. Time-out procedure was performed. The limb was wrapped with an  Esmarch bandage and the tourniquet inflated to 365 mmHg.   We began the operation by making a 6cm anterior midline incision. Small bleeders in the skin and the subcutaneous tissue identified and cauterized. Transverse retinaculum was incised and reflected medially and a medial parapatellar arthrotomy was accomplished. the patella was everted and theprepatellar fat pad resected. The superficial medial collateral ligament was then elevated from anterior to posterior along the proximal flare of the tibia and anterior half of the menisci resected. The knee was hyperflexed exposing bone on bone arthritis. Peripheral and notch osteophytes as well as the cruciate ligaments were then resected. We continued to work our way around posteriorly along the proximal tibia, and externally rotated the tibia subluxing it out from underneath the femur. A McHale retractor was placed through the notch and a lateral Hohmann retractor placed, and an external tibial guide was placed.  The tibial cutting guide was pinned into place allowing resection of 4 mm of bone medially and about 6 mm of bone laterally  because of her varus deformity.   Satisfied with the tibial resection, we then entered the distal femur 2 mm anterior to the PCL origin with the intramedullary guide rod and applied the distal femoral cutting guide set at 28mm, with 5 degrees of valgus. This was pinned along the epicondylar axis. At this point, the distal femoral cut was accomplished without difficulty. We then sized for a 5 femoral component and pinned the guide in 3 degrees of external rotation.The chamfer cutting guide was pinned into place. The anterior, posterior, and chamfer cuts were accomplished without difficulty followed by the  RP box cutting guide and the box cut. We also removed posterior osteophytes from the posterior femoral condyles. At this time, the knee was brought into full extension. We checked our extension and flexion gaps and found them  symmetric at 6.  The patella thickness measured at 24m m. We set the cutting guide at 13 and removed the posterior patella sized for 35 button and drilled the lollipop. The knee was then once again hyperflexed exposing the proximal tibia. We sized for a # 7 tibial base plate, applied the smokestack and the conical reamer followed by the the Delta fin keel punch. We then hammered into place the  RP trial femoral component, inserted a trial bearing, trial patellar button, and took the knee through range of motion from 0-130 degrees. No thumb pressure was required for patellar tracking.   At this point, all trial components were removed, a double batch of DePuy HV cement and Zinacef 1500mg   was mixed and applied to all bony metallic mating surfaces. In order, we hammered into place the tibial tray and removed excess cement, the femoral component and removed excess cement, a 6 mm  RP bearing was inserted, and the knee brought to full extension with compression. The patellar button was clamped into place, and excess cement removed. While the cement cured the wound was irrigated out with normal saline solution pulse lavage, and exparel was injected throughout the knee. Ligament stability and patellar tracking were checked and found to be excellent..   The parapatellar arthrotomy was closed with  #1 Vicryl suture. The subcutaneous tissue with 0 and 2-0 undyed Vicryl suture, and 4-0 Monocryl.. A dressing of Aquaseal, 4 x 4, dressing sponges, Webril, and Ace wrap applied. Needle and sponge count were correct times 2.The patient awakened, extubated, and taken to recovery room without difficulty. Vascular status was normal, pulses 2+ and symmetric.    Lorn Junes 02/13/2018, 8:56 AM

## 2018-03-20 NOTE — H&P (Signed)
TOTAL KNEE ADMISSION H&P  Patient is being admitted for right total knee arthroplasty.  Subjective:  Chief Complaint:right knee pain.  HPI: Nicole [name], 72 y.o. female, has a history of pain and functional disability in the right knee due to arthritis and has failed non-surgical conservative treatments for greater than 12 weeks to includeNSAID's and/or analgesics, corticosteriod injections, viscosupplementation injections, flexibility and strengthening excercises, supervised PT with diminished ADL's post treatment, use of assistive devices and activity modification.  Onset of symptoms was gradual, starting 10 years ago with gradually worsening course since that time. The patient noted no past surgery on the right knee(s).  Patient currently rates pain in the right knee(s) at 10 out of 10 with activity. Patient has night pain, worsening of pain with activity and weight bearing, pain that interferes with activities of daily living, crepitus and joint swelling.  Patient has evidence of subchondral sclerosis, periarticular osteophytes and joint space narrowing by imaging studies.  There is no active infection.  Patient Active Problem List   Diagnosis Date Noted  . Primary localized osteoarthritis of right knee 03/08/2018  . Hypertension   . Diabetes mellitus, controlled ([place])   . Diabetes [facility])    Past Medical History:  Diagnosis Date  . Diabetes mellitus    dx 5 - 6 yrs ago.  Marland Kitchen Hypertension   . Primary localized osteoarthritis of right knee 03/08/2018    Past Surgical History:  Procedure Laterality Date  . ABDOMINAL HYSTERECTOMY      Current Facility-Administered Medications  Medication Dose Route Frequency Provider Last Rate Last Dose  . bupivacaine liposome (EXPAREL) 1.3 % injection 266 mg  20 mL Other Once Elsie Saas, MD      . ceFAZolin (ANCEF) IVPB 2g/100 mL premix  2 g Intravenous To Emeline Gins, MD      . chlorhexidine (HIBICLENS) 4 % liquid 4 application  60  mL Topical Once Shepperson, Kirstin, PA-C      . chlorhexidine (HIBICLENS) 4 % liquid 4 application  60 mL Topical Once Shepperson, Kirstin, PA-C      . fentaNYL (SUBLIMAZE) 100 MCG/2ML injection           . lactated ringers infusion   Intravenous Continuous Shepperson, Kirstin, PA-C      . midazolam (VERSED) 2 MG/2ML injection           . povidone-iodine (BETADINE) 7.5 % scrub   Topical Once Shepperson, Kirstin, PA-C      . tranexamic acid (CYKLOKAPRON) 1,000 mg in sodium chloride 0.9 % 100 mL IVPB  1,000 mg Intravenous To OR Elsie Saas, MD       No Known Allergies  Social History   Tobacco Use  . Smoking status: Never Smoker  . Smokeless tobacco: Never Used  Substance Use Topics  . Alcohol use: No    History reviewed. No pertinent family history.   ROS  Objective:  Physical Exam  Vital signs in last 24 hours: Temp:  [98.6 F (37 C)] 98.6 F (37 C) (04/29 0730) Pulse Rate:  [77] 77 (04/29 0730) Resp:  [18] 18 (04/29 0730) BP: (128)/(58) 128/58 (04/29 0730) SpO2:  [98 %] 98 % (04/29 0730) Weight:  [87.1 kg (192 lb)] 87.1 kg (192 lb) (04/29 0730)  Labs:   Estimated body mass index is 32.96 kg/m as calculated from the following:   Height as of this encounter: 5\' 4"  (1.626 m).   Weight as of this encounter: 87.1 kg (192 lb).   Imaging Review  Plain radiographs demonstrate severe degenerative joint disease of the right knee(s). The overall alignment issignificant varus. The bone quality appears to be good for age and reported activity level.   Preoperative templating of the joint replacement has been completed, documented, and submitted to the Operating Room personnel in order to optimize intra-operative equipment management.    Patient's anticipated LOS is less than 2 midnights, meeting these requirements: - Younger than 13 - Lives within 1 hour of care - Has a competent adult at home to recover with post-op recover - NO history of  - Chronic pain requiring  opiods  - Diabetes  - Coronary Artery Disease  - Heart failure  - Heart attack  - Stroke  - DVT/VTE  - Cardiac arrhythmia  - Respiratory Failure/COPD  - Renal failure  - Anemia  - Advanced Liver disease        Assessment/Plan:  End stage arthritis, right knee Principal Problem:   Primary localized osteoarthritis of right knee Active Problems:   Hypertension   Diabetes mellitus, controlled ([place])   Diabetes ([place])    The patient history, physical examination, clinical judgment of the provider and imaging studies are consistent with end stage degenerative joint disease of the right knee(s) and total knee arthroplasty is deemed medically necessary. The treatment options including medical management, injection therapy arthroscopy and arthroplasty were discussed at length. The risks and benefits of total knee arthroplasty were presented and reviewed. The risks due to aseptic loosening, infection, stiffness, patella tracking problems, thromboembolic complications and other imponderables were discussed. The patient acknowledged the explanation, agreed to proceed with the plan and consent was signed. Patient is being admitted for inpatient treatment for surgery, pain control, PT, OT, prophylactic antibiotics, VTE prophylaxis, progressive ambulation and ADL's and discharge planning. The patient is planning to be discharged home with home health services

## 2018-03-20 NOTE — Anesthesia Procedure Notes (Signed)
Spinal  Patient location during procedure: OR Start time: 03/20/2018 9:15 AM End time: 03/20/2018 9:17 AM Staffing Anesthesiologist: Janeece Riggers, MD Preanesthetic Checklist Completed: patient identified, site marked, surgical consent, pre-op evaluation, timeout performed, IV checked, risks and benefits discussed and monitors and equipment checked Spinal Block Patient position: sitting Prep: DuraPrep Patient monitoring: heart rate, cardiac monitor, continuous pulse ox and blood pressure Approach: midline Location: L4-5 Injection technique: single-shot Needle Needle type: Sprotte  Needle gauge: 24 G Needle length: 9 cm Assessment Sensory level: T4

## 2018-03-21 ENCOUNTER — Encounter (HOSPITAL_COMMUNITY): Payer: Self-pay | Admitting: Orthopedic Surgery

## 2018-03-21 DIAGNOSIS — M1711 Unilateral primary osteoarthritis, right knee: Secondary | ICD-10-CM | POA: Diagnosis present

## 2018-03-21 DIAGNOSIS — E119 Type 2 diabetes mellitus without complications: Secondary | ICD-10-CM | POA: Diagnosis present

## 2018-03-21 DIAGNOSIS — I10 Essential (primary) hypertension: Secondary | ICD-10-CM | POA: Diagnosis present

## 2018-03-21 DIAGNOSIS — M25561 Pain in right knee: Secondary | ICD-10-CM | POA: Diagnosis present

## 2018-03-21 DIAGNOSIS — I959 Hypotension, unspecified: Secondary | ICD-10-CM | POA: Diagnosis present

## 2018-03-21 LAB — CBC
HCT: 33.9 % — ABNORMAL LOW (ref 36.0–46.0)
Hemoglobin: 10.8 g/dL — ABNORMAL LOW (ref 12.0–15.0)
MCH: 30.4 pg (ref 26.0–34.0)
MCHC: 31.9 g/dL (ref 30.0–36.0)
MCV: 95.5 fL (ref 78.0–100.0)
Platelets: 209 10*3/uL (ref 150–400)
RBC: 3.55 MIL/uL — ABNORMAL LOW (ref 3.87–5.11)
RDW: 13.8 % (ref 11.5–15.5)
WBC: 11.2 10*3/uL — ABNORMAL HIGH (ref 4.0–10.5)

## 2018-03-21 LAB — BASIC METABOLIC PANEL
Anion gap: 9 (ref 5–15)
BUN: 11 mg/dL (ref 6–20)
CO2: 25 mmol/L (ref 22–32)
Calcium: 9.8 mg/dL (ref 8.9–10.3)
Chloride: 105 mmol/L (ref 101–111)
Creatinine, Ser: 0.62 mg/dL (ref 0.44–1.00)
GFR calc Af Amer: >60 mL/min (ref >60)
GFR calc non Af Amer: >60 mL/min (ref >60)
Glucose, Bld: 202 mg/dL — ABNORMAL HIGH (ref 65–99)
Potassium: 3.8 mmol/L (ref 3.5–5.1)
Sodium: 139 mmol/L (ref 135–145)

## 2018-03-21 MED ORDER — SODIUM CHLORIDE 0.9 % IV BOLUS
500.0000 mL | Freq: Once | INTRAVENOUS | Status: AC
  Administered 2018-03-21: 500 mL via INTRAVENOUS

## 2018-03-21 NOTE — Progress Notes (Signed)
OT Cancellation Note  Patient Details Name: GAETANA KAWAHARA MRN: [id number] DOB: [birth year]   Cancelled Treatment:    Reason Eval/Treat Not Completed: Other (comment): Pt just returning to bed with RN assistance and applying CPM for the first time since yesterday evening per family. RN requesting OT return at later time for evaluation.   Norman Herrlich, MS OTR/L  Pager: [place] A Amenah Tucci 03/21/2018, 1:35 PM

## 2018-03-21 NOTE — Care Plan (Signed)
Met with patient prior to surgery. She plans to discharge to home with husband and HHPT provided by [facility] care. She has her equipment at home. She will transition to OPPT at [facility] PT on 04/03/18 at 1100 and follow that with an appointment with Dr Noemi Chapel at 200. She is aware of the plan and agreeable.  Met with patient and husband at bedside this am and they state no current needs. She has been up with staff and PT. Doing well.   Please contact Ladell Heads, Lucky with questions or if this plan should need to change.

## 2018-03-21 NOTE — Progress Notes (Signed)
Physical Therapy Treatment Patient Details Name: Nicole [name] MRN: [id number] DOB: September 13, 1946 Today's Date: 03/21/2018    History of Present Illness Pt is a 72 y/o female s/p R TKA. PMH includes DM and HTN.     PT Comments    Pt progressing well with post-op mobility. Pt reports the biggest limiting factor is nausea/lightheadedness at this time. Pt declining anti-nausea medication during session however taking breaks to dry-heave during gait training. Will plan for an afternoon session to initiate stair training. Will continue to follow.    Follow Up Recommendations  Follow surgeon's recommendation for DC plan and follow-up therapies;Supervision for mobility/OOB     Equipment Recommendations  None recommended by PT    Recommendations for Other Services       Precautions / Restrictions Precautions Precautions: Knee Precaution Booklet Issued: Yes (comment) Precaution Comments: Pt was educated on bone foam use, resting in extension, and NO pillow/roll/ice pack under knee.  Required Braces or Orthoses: Knee Immobilizer - Right Knee Immobilizer - Right: Other (comment)(until discontinued ) Restrictions Weight Bearing Restrictions: Yes RLE Weight Bearing: Weight bearing as tolerated    Mobility  Bed Mobility               General bed mobility comments: Pt was received sitting up in recliner  Transfers Overall transfer level: Needs assistance Equipment used: Rolling walker (2 wheeled) Transfers: Sit to/from Stand Sit to Stand: Min guard         General transfer comment: Close guard for safety as pt powered up to full standing position. VC's for hand placement on seated surface for safety.   Ambulation/Gait Ambulation/Gait assistance: Min guard Ambulation Distance (Feet): 200 Feet Assistive device: Rolling walker (2 wheeled) Gait Pattern/deviations: Step-to pattern;Decreased step length - right;Decreased step length - left;Decreased weight shift to  right;Antalgic Gait velocity: Decreased  Gait velocity interpretation: <1.31 ft/sec, indicative of household ambulator General Gait Details: Slow but generally steady. Pt reports nausea and took 2 standing rest breaks to dry heave but was motivated to continue ambulating.    Stairs             Wheelchair Mobility    Modified Rankin (Stroke Patients Only)       Balance Overall balance assessment: Needs assistance Sitting-balance support: No upper extremity supported;Feet supported Sitting balance-Leahy Scale: Good     Standing balance support: Bilateral upper extremity supported;During functional activity Standing balance-Leahy Scale: Poor Standing balance comment: Reliant on BUE support.                             Cognition Arousal/Alertness: Awake/alert Behavior During Therapy: WFL for tasks assessed/performed Overall Cognitive Status: Within Functional Limits for tasks assessed                                        Exercises Total Joint Exercises Ankle Circles/Pumps: AROM;Both;20 reps Quad Sets: AROM;Right;10 reps Hip ABduction/ADduction: AROM;Right;10 reps Knee Flexion: 10 reps Goniometric ROM: 5-92 AROM    General Comments        Pertinent Vitals/Pain Pain Assessment: Faces Faces Pain Scale: Hurts little more Pain Location: R knee  Pain Descriptors / Indicators: Aching;Operative site guarding Pain Intervention(s): Monitored during session    Home Living                      Prior  Function            PT Goals (current goals can now be found in the care plan section) Acute Rehab PT Goals Patient Stated Goal: to go home  PT Goal Formulation: With patient Time For Goal Achievement: 04/03/18 Potential to Achieve Goals: Good Progress towards PT goals: Progressing toward goals    Frequency    7X/week      PT Plan Current plan remains appropriate    Co-evaluation              AM-PAC PT "6  Clicks" Daily Activity  Outcome Measure  Difficulty turning over in bed (including adjusting bedclothes, sheets and blankets)?: A Little Difficulty moving from lying on back to sitting on the side of the bed? : A Little Difficulty sitting down on and standing up from a chair with arms (e.g., wheelchair, bedside commode, etc,.)?: A Little Help needed moving to and from a bed to chair (including a wheelchair)?: A Little Help needed walking in hospital room?: A Little Help needed climbing 3-5 steps with a railing? : A Lot 6 Click Score: 17    End of Session Equipment Utilized During Treatment: Gait belt;Right knee immobilizer Activity Tolerance: Patient limited by pain Patient left: in chair;with call bell/phone within reach;with family/visitor present Nurse Communication: Mobility status PT Visit Diagnosis: Other abnormalities of gait and mobility (R26.89);Pain;Muscle weakness (generalized) (M62.81) Pain - Right/Left: Right Pain - part of body: Knee     Time: 0940-1010 PT Time Calculation (min) (ACUTE ONLY): 30 min  Charges:  $Gait Training: 8-22 mins $Therapeutic Exercise: 8-22 mins                    G Codes:       [name], PT, DPT Acute Rehabilitation Services Pager: [phone]    [name] 03/21/2018, 12:55 PM

## 2018-03-21 NOTE — Care Management Note (Signed)
Case Management Note  Patient Details  Name: Nicole [name] MRN: [id number] Date of Birth: [birth year]  Subjective/Objective:   72 yr old female s/p right total knee arthroplasty.                 Action/Plan: Case manager spoke with patient and her daughter concerning discharge plan and DME. Patient was preoperatively setup with [facility], no changes. She has received RW and CPM at home. Patient will have family support at discharge.    Expected Discharge Date:   03/22/18               Expected Discharge Plan:  [place]  In-House Referral:  NA  Discharge planning Services  CM Consult  Post Acute Care Choice:  Durable Medical Equipment, Home Health Choice offered to:  Patient, Adult Children  DME Arranged:  CPM, Walker rolling DME Agency:  [employer]  HH Arranged:  PT [place]:  [place]  Status of Service:  Completed, signed off  If discussed at [employer] of Stay Meetings, dates discussed:    Additional Comments:  Ninfa Meeker, RN 03/21/2018, 2:58 PM

## 2018-03-21 NOTE — Progress Notes (Signed)
Physical Therapy Treatment Patient Details Name: Nicole [name] MRN: [id number] DOB: 1946/06/03 Today's Date: 03/21/2018    History of Present Illness Pt is a 72 y/o female s/p R TKA. PMH includes DM and HTN.     PT Comments    Pt progressing well towards physical therapy goals. Was able to ambulate an increased distance this session without complaints of increased pain. Pt was able to tolerate bearing weight through the RLE without the KI donned and no buckling of the R knee was noted. Pt anticipates d/c home tomorrow morning. Will continue to follow.   Follow Up Recommendations  Follow surgeon's recommendation for DC plan and follow-up therapies;Supervision for mobility/OOB     Equipment Recommendations  None recommended by PT    Recommendations for Other Services       Precautions / Restrictions Precautions Precautions: Knee Precaution Booklet Issued: Yes (comment) Precaution Comments: Pt was educated on bone foam use, resting in extension, and NO pillow/roll/ice pack under knee.  Restrictions Weight Bearing Restrictions: Yes RLE Weight Bearing: Weight bearing as tolerated    Mobility  Bed Mobility Overal bed mobility: Needs Assistance Bed Mobility: Supine to Sit     Supine to sit: Min assist     General bed mobility comments: Pt was received sitting up in recliner  Transfers Overall transfer level: Needs assistance Equipment used: Rolling walker (2 wheeled) Transfers: Sit to/from Stand Sit to Stand: Min guard         General transfer comment: Close guard for safety as pt powered up to full standing position. VC's for hand placement on seated surface for safety.   Ambulation/Gait Ambulation/Gait assistance: Min guard Ambulation Distance (Feet): 250 Feet Assistive device: Rolling walker (2 wheeled) Gait Pattern/deviations: Step-to pattern;Decreased step length - right;Decreased step length - left;Decreased weight shift to right;Antalgic Gait velocity:  Decreased  Gait velocity interpretation: <1.31 ft/sec, indicative of household ambulator General Gait Details: Slow but generally steady. VC's for improved posture, increased walker proximity, and fluidity of movement.    Stairs             Wheelchair Mobility    Modified Rankin (Stroke Patients Only)       Balance Overall balance assessment: Needs assistance Sitting-balance support: No upper extremity supported;Feet supported Sitting balance-Leahy Scale: Good     Standing balance support: Bilateral upper extremity supported;During functional activity Standing balance-Leahy Scale: Poor Standing balance comment: Reliant on BUE support.                             Cognition Arousal/Alertness: Awake/alert Behavior During Therapy: WFL for tasks assessed/performed Overall Cognitive Status: Within Functional Limits for tasks assessed                                        Exercises Total Joint Exercises Ankle Circles/Pumps: AROM;Both;20 reps Quad Sets: AROM;Right;10 reps Hip ABduction/ADduction: AROM;Right;10 reps Knee Flexion: 10 reps Goniometric ROM: 5-92 AROM    General Comments        Pertinent Vitals/Pain Pain Assessment: Faces Faces Pain Scale: Hurts little more Pain Location: R knee  Pain Descriptors / Indicators: Aching;Operative site guarding Pain Intervention(s): Monitored during session    Home Living                      Prior Function  PT Goals (current goals can now be found in the care plan section) Acute Rehab PT Goals Patient Stated Goal: to go home  PT Goal Formulation: With patient Time For Goal Achievement: 04/03/18 Potential to Achieve Goals: Good Progress towards PT goals: Progressing toward goals    Frequency    7X/week      PT Plan Current plan remains appropriate    Co-evaluation              AM-PAC PT "6 Clicks" Daily Activity  Outcome Measure  Difficulty  turning over in bed (including adjusting bedclothes, sheets and blankets)?: A Little Difficulty moving from lying on back to sitting on the side of the bed? : A Little Difficulty sitting down on and standing up from a chair with arms (e.g., wheelchair, bedside commode, etc,.)?: A Little Help needed moving to and from a bed to chair (including a wheelchair)?: A Little Help needed walking in hospital room?: A Little Help needed climbing 3-5 steps with a railing? : A Lot 6 Click Score: 17    End of Session Equipment Utilized During Treatment: Gait belt Activity Tolerance: Patient limited by pain Patient left: in chair;with call bell/phone within reach;with family/visitor present Nurse Communication: Mobility status PT Visit Diagnosis: Other abnormalities of gait and mobility (R26.89);Pain;Muscle weakness (generalized) (M62.81) Pain - Right/Left: Right Pain - part of body: Knee     Time: 1420-1450 PT Time Calculation (min) (ACUTE ONLY): 30 min  Charges:  $Gait Training: 23-37 mins $Therapeutic Exercise: 8-22 mins                    G Codes:       Nicole [name], PT, DPT Acute Rehabilitation Services Pager: [phone]    [name] 03/21/2018, 3:29 PM

## 2018-03-21 NOTE — Progress Notes (Signed)
Subjective: 1 Day Post-Op Procedure(s) (LRB): RIGHT TOTAL KNEE ARTHROPLASTY (Right) Patient reports pain as 4 on 0-10 scale and 8 on 0-10 scale.    Objective: Vital signs in last 24 hours: Temp:  [97.7 F (36.5 C)-98.4 F (36.9 C)] 97.7 F (36.5 C) (04/30 0509) Pulse Rate:  [73-104] 73 (04/30 0509) Resp:  [13-19] 16 (04/30 0509) BP: (92-126)/(53-97) 104/56 (04/30 0509) SpO2:  [93 %-98 %] 94 % (04/30 0509)  Intake/Output from previous day: 04/29 0701 - 04/30 0700 In: 1966.7 [P.O.:480; I.V.:1386.7; IV Piggyback:100] Out: 110 [Urine:100; Blood:10] Intake/Output this shift: Total I/O In: -  Out: 450 [Urine:450]  Recent Labs    03/21/18 0257  HGB 10.8*   Recent Labs    03/21/18 0257  WBC 11.2*  RBC 3.55*  HCT 33.9*  PLT 209   Recent Labs    03/21/18 0257  NA 139  K 3.8  CL 105  CO2 25  BUN 11  CREATININE 0.62  GLUCOSE 202*  CALCIUM 9.8   No results for input(s): LABPT, INR in the last 72 hours.  ABD soft Neurovascular intact Sensation intact distally Intact pulses distally Dorsiflexion/Plantar flexion intact Incision: dressing C/D/I   Patient struggling with pain control and hypotension.  Assessment/Plan: 1 Day Post-Op Procedure(s) (LRB): RIGHT TOTAL KNEE ARTHROPLASTY (Right)  Principal Problem:   Primary localized osteoarthritis of right knee Active Problems:   Hypertension   Diabetes mellitus, controlled ([place])   Diabetes ([place])  Advance diet Up with therapy  Will give a fluid bolus this am and this afternoon.  Will check at lunch to see if she can discharge today or will need to wait until tomorrow.    Vern Prestia J Sundai Probert 03/21/2018, 8:47 AM

## 2018-03-21 NOTE — Progress Notes (Signed)
Patient ID: Nicole [name], female   DOB: 1946-10-23, 72 y.o.   MRN: [id number]  Patient had two episodes of vomitting this am and is still a little peaked.  Will plan on discharge in the am    Will give another fluid bolus now.  Will change status from observation to inpatient due to medically necessity of a second night in the hospital for IV fluid and IV pain control.  Baron Parmelee A. Kaleen Mask Physician Assistant [facility] [phone]  03/21/2018, 2:48 PM

## 2018-03-21 NOTE — Progress Notes (Signed)
Patient states she has RW, does not need 3/1, and has CPM at home already

## 2018-03-21 NOTE — Care Management Obs Status (Signed)
[facility] NOTIFICATION   Patient Details  Name: Nicole [name] MRN: [id number] Date of Birth: [birth year]   Medicare Observation Status Notification Given:  Yes    Carles Collet, RN 03/21/2018, 9:50 AM

## 2018-03-22 LAB — CBC
HCT: 31.3 % — ABNORMAL LOW (ref 36.0–46.0)
Hemoglobin: 9.9 g/dL — ABNORMAL LOW (ref 12.0–15.0)
MCH: 30.7 pg (ref 26.0–34.0)
MCHC: 31.6 g/dL (ref 30.0–36.0)
MCV: 97.2 fL (ref 78.0–100.0)
Platelets: 216 10*3/uL (ref 150–400)
RBC: 3.22 MIL/uL — ABNORMAL LOW (ref 3.87–5.11)
RDW: 14.3 % (ref 11.5–15.5)
WBC: 11.4 10*3/uL — ABNORMAL HIGH (ref 4.0–10.5)

## 2018-03-22 LAB — BASIC METABOLIC PANEL
Anion gap: 6 (ref 5–15)
BUN: 16 mg/dL (ref 6–20)
CO2: 27 mmol/L (ref 22–32)
Calcium: 9.7 mg/dL (ref 8.9–10.3)
Chloride: 107 mmol/L (ref 101–111)
Creatinine, Ser: 0.54 mg/dL (ref 0.44–1.00)
GFR calc Af Amer: >60 mL/min (ref >60)
GFR calc non Af Amer: >60 mL/min (ref >60)
Glucose, Bld: 183 mg/dL — ABNORMAL HIGH (ref 65–99)
Potassium: 4.3 mmol/L (ref 3.5–5.1)
Sodium: 140 mmol/L (ref 135–145)

## 2018-03-22 MED ORDER — DOCUSATE SODIUM 100 MG PO CAPS: ORAL_CAPSULE | ORAL | 0 refills | Status: DC

## 2018-03-22 MED ORDER — POLYETHYLENE GLYCOL 3350 17 G PO PACK: PACK | ORAL | 0 refills | Status: DC

## 2018-03-22 MED ORDER — ASPIRIN 325 MG PO TBEC: DELAYED_RELEASE_TABLET | ORAL | 0 refills | Status: DC

## 2018-03-22 MED ORDER — GABAPENTIN 300 MG PO CAPS: 300.0000 mg | ORAL_CAPSULE | Freq: Every day | ORAL | 0 refills | Status: DC

## 2018-03-22 MED ORDER — OXYCODONE HCL 5 MG PO TABS: ORAL_TABLET | ORAL | 0 refills | Status: DC

## 2018-03-22 NOTE — Evaluation (Signed)
Occupational Therapy Evaluation and Discharge Patient Details Name: Nicole [name] MRN: [id number] DOB: August 07, 1946 Today's Date: 03/22/2018    History of Present Illness (P) Pt is a 72 y/o female s/p R TKA. PMH includes DM and HTN.    Clinical Impression   PTA, pt was independent with ADL and functional mobility. She is currently limited by R knee pain but motivated to return to independence. Pt currently requires min assist for LB ADL, min assist for walk-in shower transfers, and min guard assist for toilet transfers and hygiene. Pt's daughter and husband present and engaged in session. Educated pt and family concerning compensatory LB ADL strategies, knee precautions related to ADL and safe shower transfer methods. Pt's husband will be able to provide the necessary assistance. He reports being anxious about her return home and OT provided further education and encouragement. No further acute OT needs identified. OT will sign off.     Follow Up Recommendations  No OT follow up;Supervision/Assistance - 24 hour    Equipment Recommendations  3 in 1 bedside commode    Recommendations for Other Services       Precautions / Restrictions Precautions Precautions: (P) Knee Precaution Booklet Issued: (P) Yes (comment) Precaution Comments: (P) Pt was educated on bone foam use, resting in extension, and NO pillow/roll/ice pack under knee.  Required Braces or Orthoses: (P) Knee Immobilizer - Right Knee Immobilizer - Right: (P) Other (comment)(until discontinued ) Restrictions Weight Bearing Restrictions: (P) Yes RLE Weight Bearing: (P) Weight bearing as tolerated      Mobility Bed Mobility               General bed mobility comments: (P) Pt was received sitting up in recliner  Transfers Overall transfer level: (P) Needs assistance Equipment used: (P) Rolling walker (2 wheeled) Transfers: (P) Sit to/from Stand Sit to Stand: (P) Min guard         General transfer comment: (P)  Close guard for safety as pt powered up to full standing position. VC's for hand placement on seated surface for safety.     Balance Overall balance assessment: (P) Needs assistance Sitting-balance support: (P) No upper extremity supported;Feet supported Sitting balance-Leahy Scale: (P) Good     Standing balance support: (P) Bilateral upper extremity supported;During functional activity Standing balance-Leahy Scale: (P) Poor Standing balance comment: (P) Reliant on BUE support.                            ADL either performed or assessed with clinical judgement   ADL Overall ADL's : Needs assistance/impaired Eating/Feeding: Set up;Sitting   Grooming: Min guard;Standing   Upper Body Bathing: Set up;Sitting   Lower Body Bathing: Minimal assistance;Sit to/from stand   Upper Body Dressing : Set up;Sitting   Lower Body Dressing: Minimal assistance;Sit to/from stand Lower Body Dressing Details (indicate cue type and reason): cues to avoid twisting knee Toilet Transfer: Min guard;Ambulation;RW   Toileting- Water quality scientist and Hygiene: Min guard;Sit to/from stand   Tub/ Shower Transfer: Minimal assistance;Walk-in shower;Ambulation;Shower seat;3 in 1   Functional mobility during ADLs: Min guard;Rolling walker General ADL Comments: Pt and family educated concerning compensatory strategies for LB ADL, safe shower transfers with 3-in-1 or fold down shower seat, and knee precautions related to ADL.      Vision Patient Visual Report: No change from baseline Vision Assessment?: No apparent visual deficits     Perception     Praxis  Pertinent Vitals/Pain Pain Assessment: (P) Faces Faces Pain Scale: (P) Hurts little more Pain Location: (P) R knee  Pain Descriptors / Indicators: (P) Aching;Operative site guarding Pain Intervention(s): (P) Monitored during session     Hand Dominance Right   Extremity/Trunk Assessment Upper Extremity Assessment Upper  Extremity Assessment: Overall WFL for tasks assessed   Lower Extremity Assessment Lower Extremity Assessment: RLE deficits/detail RLE Deficits / Details: Decreased strength and ROM as expected post-operatively.    Cervical / Trunk Assessment Cervical / Trunk Assessment: Normal   Communication Communication Communication: No difficulties   Cognition Arousal/Alertness: (P) Awake/alert Behavior During Therapy: (P) WFL for tasks assessed/performed Overall Cognitive Status: (P) Within Functional Limits for tasks assessed                                     General Comments       Exercises Exercises: (P) Total Joint Total Joint Exercises Ankle Circles/Pumps: (P) AROM;Both;20 reps Quad Sets: (P) AROM;Right;10 reps Heel Slides: (P) AROM;Right;10 reps Hip ABduction/ADduction: (P) AROM;Right;10 reps Knee Flexion: (P) 10 reps   Shoulder Instructions      Home Living Family/patient expects to be discharged to:: Private residence Living Arrangements: Spouse/significant other Available Help at Discharge: Family;Available 24 hours/day Type of Home: House Home Access: Stairs to enter [employer] of Steps: 2 Entrance Stairs-Rails: None Home Layout: One level     Bathroom Shower/Tub: Teacher, early years/pre: Handicapped height     Home Equipment: Environmental consultant - 2 wheels;Shower seat - built in;Grab bars - tub/shower          Prior Functioning/Environment Level of Independence: Independent                 OT Problem List: Decreased strength;Decreased activity tolerance;Impaired balance (sitting and/or standing);Decreased safety awareness;Decreased knowledge of use of DME or AE;Decreased knowledge of precautions;Pain      OT Treatment/Interventions:      OT Goals(Current goals can be found in the care plan section) Acute Rehab OT Goals Patient Stated Goal: (P) to go home  OT Goal Formulation: With patient Time For Goal Achievement:  04/05/18 Potential to Achieve Goals: Good  OT Frequency:     Barriers to D/C:            Co-evaluation              AM-PAC PT "6 Clicks" Daily Activity     Outcome Measure Help from another person eating meals?: None Help from another person taking care of personal grooming?: A Little Help from another person toileting, which includes using toliet, bedpan, or urinal?: A Little Help from another person bathing (including washing, rinsing, drying)?: A Little Help from another person to put on and taking off regular upper body clothing?: None Help from another person to put on and taking off regular lower body clothing?: A Little 6 Click Score: 20   End of Session Equipment Utilized During Treatment: Rolling walker CPM Right Knee Additional Comments: (P) applied at 1140 Nurse Communication: Mobility status(RN present at end of session)  Activity Tolerance: Patient tolerated treatment well Patient left: in chair;with call bell/phone within reach;with family/visitor present  OT Visit Diagnosis: Other abnormalities of gait and mobility (R26.89);Pain Pain - Right/Left: Right Pain - part of body: Knee                Time: 1125-1140 OT Time Calculation (min): 15 min Charges:  OT General Charges $OT Visit: 1 Visit OT Evaluation $OT Eval Low Complexity: 1 Low G-Codes:     Nicole Herrlich, MS OTR/L  Pager: Nicole [name] 03/22/2018, 12:56 PM

## 2018-03-22 NOTE — Progress Notes (Signed)
Physical Therapy Treatment Patient Details Name: Nicole [name] MRN: [id number] DOB: July 01, 1946 Today's Date: 03/22/2018    History of Present Illness Pt is a 72 y/o female s/p R TKA. PMH includes DM and HTN.     PT Comments    Pt progressing well with post-op mobility. Pain was the main limiting factor during session today however pt motivated and continued to show a good rehab effort. Stair training completed with family present for education. Pt was also educated on HEP, car transfer, and safe activity progression. Will continue to follow and progress as able per POC.   Follow Up Recommendations  Follow surgeon's recommendation for DC plan and follow-up therapies;Supervision for mobility/OOB     Equipment Recommendations  None recommended by PT    Recommendations for Other Services       Precautions / Restrictions Precautions Precautions: Knee Precaution Booklet Issued: Yes (comment) Precaution Comments: Pt was educated on bone foam use, resting in extension, and NO pillow/roll/ice pack under knee.  Required Braces or Orthoses: Knee Immobilizer - Right Knee Immobilizer - Right: Other (comment)(until discontinued ) Restrictions Weight Bearing Restrictions: Yes RLE Weight Bearing: Weight bearing as tolerated    Mobility  Bed Mobility               General bed mobility comments: Pt was received sitting up in recliner  Transfers Overall transfer level: Needs assistance Equipment used: Rolling walker (2 wheeled) Transfers: Sit to/from Stand Sit to Stand: Min guard         General transfer comment: Close guard for safety as pt powered up to full standing position. VC's for hand placement on seated surface for safety.   Ambulation/Gait Ambulation/Gait assistance: Min guard Ambulation Distance (Feet): 200 Feet Assistive device: Rolling walker (2 wheeled) Gait Pattern/deviations: Step-to pattern;Decreased step length - right;Decreased step length -  left;Decreased weight shift to right;Antalgic Gait velocity: Decreased  Gait velocity interpretation: 1.31 - 2.62 ft/sec, indicative of limited community ambulator General Gait Details: Slow but generally steady. VC's for improved posture, increased walker proximity, and fluidity of movement.    Stairs Stairs: Yes Stairs assistance: Min guard Stair Management: No rails;Backwards;With walker;Step to pattern Number of Stairs: 4(2x2) General stair comments: VC's for sequencing and general safety. Pt, husband, and daughter present throughout education.    Wheelchair Mobility    Modified Rankin (Stroke Patients Only)       Balance Overall balance assessment: Needs assistance Sitting-balance support: No upper extremity supported;Feet supported Sitting balance-Leahy Scale: Good     Standing balance support: Bilateral upper extremity supported;During functional activity Standing balance-Leahy Scale: Poor Standing balance comment: Reliant on BUE support.                             Cognition Arousal/Alertness: Awake/alert Behavior During Therapy: WFL for tasks assessed/performed Overall Cognitive Status: Within Functional Limits for tasks assessed                                        Exercises      General Comments        Pertinent Vitals/Pain Pain Assessment: Faces Faces Pain Scale: Hurts little more Pain Location: R knee  Pain Descriptors / Indicators: Aching;Operative site guarding Pain Intervention(s): Monitored during session    Home Living Family/patient expects to be discharged to:: Private residence Living Arrangements: Spouse/significant other Available  Help at Discharge: Family;Available 24 hours/day Type of Home: House Home Access: Stairs to enter Entrance Stairs-Rails: None Home Layout: One level Home Equipment: Environmental consultant - 2 wheels;Shower seat - built in;Grab bars - tub/shower      Prior Function Level of Independence:  Independent          PT Goals (current goals can now be found in the care plan section) Acute Rehab PT Goals Patient Stated Goal: to go home  PT Goal Formulation: With patient Time For Goal Achievement: 04/03/18 Potential to Achieve Goals: Good Progress towards PT goals: Progressing toward goals    Frequency    7X/week      PT Plan Current plan remains appropriate    Co-evaluation              AM-PAC PT "6 Clicks" Daily Activity  Outcome Measure  Difficulty turning over in bed (including adjusting bedclothes, sheets and blankets)?: A Little Difficulty moving from lying on back to sitting on the side of the bed? : A Little Difficulty sitting down on and standing up from a chair with arms (e.g., wheelchair, bedside commode, etc,.)?: A Little Help needed moving to and from a bed to chair (including a wheelchair)?: A Little Help needed walking in hospital room?: A Little Help needed climbing 3-5 steps with a railing? : A Little 6 Click Score: 18    End of Session Equipment Utilized During Treatment: Gait belt Activity Tolerance: Patient tolerated treatment well Patient left: in chair;with call bell/phone within reach;with family/visitor present Nurse Communication: Mobility status PT Visit Diagnosis: Other abnormalities of gait and mobility (R26.89);Pain;Muscle weakness (generalized) (M62.81) Pain - Right/Left: Right Pain - part of body: Knee     Time: 2694-8546 PT Time Calculation (min) (ACUTE ONLY): 37 min  Charges:  $Gait Training: 23-37 mins                    G Codes:       [name], PT, DPT Acute Rehabilitation Services Pager: [phone]    [name] 03/22/2018, 1:00 PM

## 2018-03-22 NOTE — Discharge Summary (Signed)
Patient ID: LIRIO BACH MRN: [id number] DOB/AGE: Mar 18, 1946 72 y.o.  Admit date: 03/20/2018 Discharge date: 03/22/2018  Admission Diagnoses:  Principal Problem:   Primary localized osteoarthritis of right knee Active Problems:   Hypertension   Diabetes mellitus, controlled ([place])   Diabetes ([place])   Discharge Diagnoses:  Same  Past Medical History:  Diagnosis Date  . Diabetes mellitus    dx 5 - 6 yrs ago.  [place] Kitchen Hypertension   . Primary localized osteoarthritis of right knee 03/08/2018    Surgeries: Procedure(s): RIGHT TOTAL KNEE ARTHROPLASTY on 03/20/2018   Consultants:   Discharged Condition: Improved  Hospital Course: Jackelyne Sayer Curlin is an 72 y.o. female who was admitted 03/20/2018 for operative treatment ofPrimary localized osteoarthritis of right knee. Patient has severe unremitting pain that affects sleep, daily activities, and work/hobbies. After pre-op clearance the patient was taken to the operating room on 03/20/2018 and underwent  Procedure(s): RIGHT TOTAL KNEE ARTHROPLASTY.    Patient was given perioperative antibiotics:  Anti-infectives (From admission, onward)   Start     Dose/Rate Route Frequency Ordered Stop   03/20/18 1600  ceFAZolin (ANCEF) IVPB 2g/100 mL premix     2 g 200 mL/hr over 30 Minutes Intravenous Every 6 hours 03/20/18 1330 03/20/18 2236   03/20/18 1025  cefUROXime (ZINACEF) injection  Status:  Discontinued       As needed 03/20/18 1025 03/20/18 1108   03/20/18 0730  ceFAZolin (ANCEF) IVPB 2g/100 mL premix     2 g 200 mL/hr over 30 Minutes Intravenous To [facility]Stay [facility] 03/17/18 1257 03/20/18 0928       Patient was given sequential compression devices, early ambulation, and chemoprophylaxis to prevent DVT.  Patient benefited maximally from hospital stay and there were no complications.    Recent vital signs:  Patient Vitals for the past 24 hrs:  BP Temp Temp src Pulse Resp SpO2  03/22/18 0444 119/62 97.7 F (36.5 C) Oral 61 16 96 %   03/21/18 2015 112/60 98.1 F (36.7 C) Oral 64 16 95 %  03/21/18 1405 (!) 113/58 98 F (36.7 C) Oral 61 16 97 %     Recent laboratory studies:  Recent Labs    03/21/18 0257 03/22/18 0358  WBC 11.2* 11.4*  HGB 10.8* 9.9*  HCT 33.9* 31.3*  PLT 209 216  NA 139 140  K 3.8 4.3  CL 105 107  CO2 25 27  BUN 11 16  CREATININE 0.62 0.54  GLUCOSE 202* 183*  CALCIUM 9.8 9.7     Discharge Medications:   Allergies as of 03/22/2018   No Known Allergies     Medication List    STOP taking these medications   hydrochlorothiazide 25 MG tablet Commonly known as:  HYDRODIURIL   lisinopril 20 MG tablet Commonly known as:  PRINIVIL,ZESTRIL     TAKE these medications   aspirin 325 MG EC tablet 1 tab a day for the next 30 days to prevent blood clots   atenolol 25 MG tablet Commonly known as:  TENORMIN Take 25 mg by mouth daily.   Biotin 5000 MCG Tabs Take 1 tablet by mouth daily.   Calcium-Vitamin D-Vitamin K [id number] MG-UNT-MCG Chew Chew 2 each by mouth daily.   docusate sodium 100 MG capsule Commonly known as:  COLACE 1 tab 2 times a day while on narcotics.  STOOL SOFTENER   gabapentin 300 MG capsule Commonly known as:  NEURONTIN Take 1 capsule (300 mg total) by mouth at bedtime.   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  LAXITIVE.  Restart if two days since last bowel movement            Discharge Care Instructions  (From admission, onward)        Start     Ordered   03/22/18 0000  Change dressing    Comments:  DO NOT REMOVE BANDAGE OVER SURGICAL INCISION.  [place] WHOLE LEG INCLUDING OVER THE WATERPROOF BANDAGE WITH SOAP AND WATER EVERY DAY.   03/22/18 0902      Diagnostic Studies: No results  found.  Disposition: Discharge disposition: 01-Home or Self Care       Discharge Instructions    CPM   Complete by:  As directed    Continuous passive motion machine (CPM):      Use the CPM from 0 to 90 for 6 hours per day.       You may break it up into 2 or 3 sessions per day.      Use CPM for 2 weeks or until you are told to stop.   Call MD / Call 911   Complete by:  As directed    If you experience chest pain or shortness of breath, CALL 911 and be transported to the hospital emergency room.  If you develope a fever above 101 F, pus (white drainage) or increased drainage or redness at the wound, or calf pain, call your surgeon's office.   Change dressing   Complete by:  As directed    DO NOT REMOVE BANDAGE OVER SURGICAL INCISION.  [place] WHOLE LEG INCLUDING OVER THE WATERPROOF BANDAGE WITH SOAP AND WATER EVERY DAY.   Constipation Prevention   Complete by:  As directed    Drink plenty of fluids.  Prune juice may be helpful.  You may use a stool softener, such as Colace (over the counter) 100 mg twice a day.  Use MiraLax (over the counter) for constipation as needed.   Diet - low sodium heart healthy   Complete by:  As directed    Discharge instructions   Complete by:  As directed    INSTRUCTIONS AFTER JOINT REPLACEMENT   Remove items at home which could result in a fall. This includes throw rugs or furniture in walking pathways ICE to the affected joint every three hours while awake for 30 minutes at a time, for at least the first 3-5 days, and then as needed for pain and swelling.  Continue to use ice for pain and swelling. You may notice swelling that will progress down to the foot and ankle.  This is normal after surgery.  Elevate your leg when you are not up walking on it.   Continue to use the breathing machine you got in the hospital (incentive spirometer) which will help keep your temperature down.  It is common for your temperature to cycle up and down following surgery,  especially at night when you are not up moving around and exerting yourself.  The breathing machine keeps your lungs expanded and your temperature down.   DIET:  As you were doing prior to hospitalization, we recommend a well-balanced diet.  DRESSING / WOUND CARE / SHOWERING  Keep the surgical dressing until follow  up.  The dressing is water proof, so you can shower without any extra covering.  IF THE DRESSING FALLS OFF or the wound gets wet inside, change the dressing with sterile gauze.  Please use good hand washing techniques before changing the dressing.  Do not use any lotions or creams on the incision until instructed by your surgeon.    ACTIVITY  Increase activity slowly as tolerated, but follow the weight bearing instructions below.   No driving for 6 weeks or until further direction given by your physician.  You cannot drive while taking narcotics.  No lifting or carrying greater than 10 lbs. until further directed by your surgeon. Avoid periods of inactivity such as sitting longer than an hour when not asleep. This helps prevent blood clots.  You may return to work once you are authorized by your doctor.     WEIGHT BEARING   Weight bearing as tolerated with assist device (walker, cane, etc) as directed, use it as long as suggested by your surgeon or therapist, typically at least 2-3 weeks.   EXERCISES  Results after joint replacement surgery are often greatly improved when you follow the exercise, range of motion and muscle strengthening exercises prescribed by your doctor. Safety measures are also important to protect the joint from further injury. Any time any of these exercises cause you to have increased pain or swelling, decrease what you are doing until you are comfortable again and then slowly increase them. If you have problems or questions, call your caregiver or physical therapist for advice.   Rehabilitation is important following a joint replacement. After just a few  days of immobilization, the muscles of the leg can become weakened and shrink (atrophy).  These exercises are designed to build up the tone and strength of the thigh and leg muscles and to improve motion. Often times heat used for twenty to thirty minutes before working out will loosen up your tissues and help with improving the range of motion but do not use heat for the first two weeks following surgery (sometimes heat can increase post-operative swelling).   These exercises can be done on a training (exercise) mat, on the floor, on a table or on a bed. Use whatever works the best and is most comfortable for you.    Use music or television while you are exercising so that the exercises are a pleasant break in your day. This will make your life better with the exercises acting as a break in your routine that you can look forward to.   Perform all exercises about fifteen times, three times per day or as directed.  You should exercise both the operative leg and the other leg as well.   Exercises include:  Quad Sets - Tighten up the muscle on the front of the thigh (Quad) and hold for 5-10 seconds.   Straight Leg Raises - With your knee straight (if you were given a brace, keep it on), lift the leg to 60 degrees, hold for 3 seconds, and slowly lower the leg.  Perform this exercise against resistance later as your leg gets stronger.  Leg Slides: Lying on your back, slowly slide your foot toward your buttocks, bending your knee up off the floor (only go as far as is comfortable). Then slowly slide your foot back down until your leg is flat on the floor again.  Angel Wings: Lying on your back spread your legs to the side as far apart as you can without causing  discomfort.  Hamstring Strength:  Lying on your back, push your heel against the floor with your leg straight by tightening up the muscles of your buttocks.  Repeat, but this time bend your knee to a comfortable angle, and push your heel against the  floor.  You may put a pillow under the heel to make it more comfortable if necessary.   A rehabilitation program following joint replacement surgery can speed recovery and prevent re-injury in the future due to weakened muscles. Contact your doctor or a physical therapist for more information on knee rehabilitation.    CONSTIPATION  Constipation is defined medically as fewer than three stools per week and severe constipation as less than one stool per week.  Even if you have a regular bowel pattern at home, your normal regimen is likely to be disrupted due to multiple reasons following surgery.  Combination of anesthesia, postoperative narcotics, change in appetite and fluid intake all can affect your bowels.   YOU MUST use at least one of the following options; they are listed in order of increasing strength to get the job done.  They are all available over the counter, and you may need to use some, POSSIBLY even all of these options:    Drink plenty of fluids (prune juice may be helpful) and high fiber foods Colace 100 mg by mouth twice a day  Senokot for constipation as directed and as needed Dulcolax (bisacodyl), take with full glass of water  Miralax (polyethylene glycol) once or twice a day as needed.  If you have tried all these things and are unable to have a bowel movement in the first 3-4 days after surgery call either your surgeon or your primary doctor.    If you experience loose stools or diarrhea, hold the medications until you stool forms back up.  If your symptoms do not get better within 1 week or if they get worse, check with your doctor.  If you experience "the worst abdominal pain ever" or develop nausea or vomiting, please contact the office immediately for further recommendations for treatment.   ITCHING:  If you experience itching with your medications, try taking only a single pain pill, or even half a pain pill at a time.  You can also use Benadryl over the counter for  itching or also to help with sleep.   TED HOSE STOCKINGS:  Use stockings on both legs until for at least 2 weeks or as directed by physician office. They may be removed at night for sleeping.  MEDICATIONS:  See your medication summary on the "After Visit Summary" that nursing will review with you.  You may have some home medications which will be placed on hold until you complete the course of blood thinner medication.  It is important for you to complete the blood thinner medication as prescribed.  PRECAUTIONS:  If you experience chest pain or shortness of breath - call 911 immediately for transfer to the hospital emergency department.   If you develop a fever greater that 101 F, purulent drainage from wound, increased redness or drainage from wound, foul odor from the wound/dressing, or calf pain - CONTACT YOUR SURGEON.                                                   FOLLOW-UP APPOINTMENTS:  If you do  not already have a post-op appointment, please call the office for an appointment to be seen by your surgeon.  Guidelines for how soon to be seen are listed in your "After Visit Summary", but are typically between 1-4 weeks after surgery.  OTHER INSTRUCTIONS:   Knee Replacement:  Do not place pillow under knee, focus on keeping the knee straight while resting. CPM instructions: 0-90 degrees, 2 hours in the morning, 2 hours in the afternoon, and 2 hours in the evening. Place foam block, curve side up under heel at all times except when in CPM or when walking.  DO NOT modify, tear, cut, or change the foam block in any way.  MAKE SURE YOU:  Understand these instructions.  Get help right away if you are not doing well or get worse.    Thank you for letting us be a part of your medical care team.  It is a privilege we respect greatly.  We hope these instructions will help you stay on track for a fast and full recovery!   Do not put a pillow under the knee. Place it under the heel.   Complete by:   As directed    Place gray foam block, curve side up under heel at all times except when in CPM or when walking.  DO NOT modify, tear, cut, or change in any way the gray foam block.   Increase activity slowly as tolerated   Complete by:  As directed    Patient may shower   Complete by:  As directed    Aquacel dressing is water proof    Wash over it and the whole leg with soap and water at the end of your shower   TED hose   Complete by:  As directed    Use stockings (TED hose) for 2 weeks on both leg(s).  You may remove them at night for sleeping.      Follow-up Information    [facility] Follow up.   Specialty:  [place] Why:  A representative from [facility] will contact you to arrange start date and time for your therapy. Contact information: [place] [address] [phone]        Elsie Saas, MD Follow up on 04/03/2018.   Specialty:  Orthopedic Surgery Why:  appointment 2 pm Contact information: [place] [address] [place] [facility] Follow up on 04/03/2018.   Why:  arrive at 10:30 for an 11 am appointment  Contact information: [place], [place] [address] [phone]           Signed: Linda Hedges 03/22/2018, 9:05 AM

## 2018-06-23 ENCOUNTER — Other Ambulatory Visit: Payer: Self-pay | Admitting: Physical Medicine and Rehabilitation

## 2018-06-23 DIAGNOSIS — M545 Low back pain: Principal | ICD-10-CM

## 2018-06-23 DIAGNOSIS — M79651 Pain in right thigh: Secondary | ICD-10-CM

## 2018-06-23 DIAGNOSIS — G8929 Other chronic pain: Secondary | ICD-10-CM

## 2018-07-07 ENCOUNTER — Ambulatory Visit
Admission: RE | Admit: 2018-07-07 | Discharge: 2018-07-07 | Disposition: A | Discharge status: Home / Self Care | Payer: Medicare Other | Source: Ambulatory Visit | Attending: Physical Medicine and Rehabilitation | Admitting: Physical Medicine and Rehabilitation

## 2018-07-07 DIAGNOSIS — M545 Low back pain: Principal | ICD-10-CM

## 2018-07-07 DIAGNOSIS — M79651 Pain in right thigh: Secondary | ICD-10-CM

## 2018-07-07 DIAGNOSIS — G8929 Other chronic pain: Secondary | ICD-10-CM

## 2018-07-07 MED ORDER — ONDANSETRON HCL 4 MG/2ML IJ SOLN
4.0000 mg | Freq: Once | INTRAMUSCULAR | Status: AC
  Administered 2018-07-07: 4 mg via INTRAMUSCULAR

## 2018-07-07 MED ORDER — DIAZEPAM 5 MG PO TABS
5.0000 mg | ORAL_TABLET | Freq: Once | ORAL | Status: AC
  Administered 2018-07-07: 5 mg via ORAL

## 2018-07-07 MED ORDER — IOPAMIDOL (ISOVUE-M 200) INJECTION 41%
15.0000 mL | Freq: Once | INTRAMUSCULAR | Status: AC
  Administered 2018-07-07: 15 mL via INTRATHECAL

## 2018-07-07 MED ORDER — MEPERIDINE HCL 100 MG/ML IJ SOLN
50.0000 mg | Freq: Once | INTRAMUSCULAR | Status: AC
  Administered 2018-07-07: 50 mg via INTRAMUSCULAR

## 2018-07-07 NOTE — Discharge Instructions (Signed)

## 2018-08-03 ENCOUNTER — Telehealth: Payer: Self-pay

## 2018-08-03 NOTE — Telephone Encounter (Signed)
Notes on file, referral sent to scheduling.  

## 2018-08-08 ENCOUNTER — Telehealth: Payer: Self-pay

## 2018-08-08 NOTE — Telephone Encounter (Signed)
NOTES FAXED TO [facility] °

## 2018-08-14 ENCOUNTER — Encounter: Payer: Self-pay | Admitting: Cardiology

## 2018-08-14 ENCOUNTER — Ambulatory Visit (INDEPENDENT_AMBULATORY_CARE_PROVIDER_SITE_OTHER): Payer: Medicare Other | Admitting: Cardiology

## 2018-08-14 VITALS — BP 120/70 | HR 66 | Ht 64.0 in | Wt 194.8 lb

## 2018-08-14 DIAGNOSIS — R600 Localized edema: Secondary | ICD-10-CM

## 2018-08-14 DIAGNOSIS — M4807 Spinal stenosis, lumbosacral region: Secondary | ICD-10-CM | POA: Diagnosis not present

## 2018-08-14 DIAGNOSIS — Z01818 Encounter for other preprocedural examination: Secondary | ICD-10-CM

## 2018-08-14 DIAGNOSIS — M4800 Spinal stenosis, site unspecified: Secondary | ICD-10-CM | POA: Insufficient documentation

## 2018-08-14 MED ORDER — SPIRONOLACTONE 25 MG PO TABS: 25.0000 mg | ORAL_TABLET | Freq: Every day | ORAL | 3 refills | Status: DC

## 2018-08-14 NOTE — Patient Instructions (Addendum)
Medication Instructions:  Start: Spironolactone 25 mg, daily, by mouth  Labwork: Future: 1 week BMET  Testing/Procedures: Your physician has requested that you have an echocardiogram. Echocardiography is a painless test that uses sound waves to create images of your heart. It provides your doctor with information about the size and shape of your heart and how well your heart's chambers and valves are working. This procedure takes approximately one hour. There are no restrictions for this procedure.  Your physician has requested that you have a bilateral lower extremity arterial  duplex. During this test anultrasound are used to evaluate arterial blood flow in the legs. Allow one hour for this exam. There are no restrictions or special instructions.  Follow-Up: Your physician recommends that you schedule a follow-up appointment in: 3 weeks with PA.  If you need a refill on your cardiac medications before your next appointment, please call your pharmacy.

## 2018-08-14 NOTE — Progress Notes (Signed)
Cardiology Office Note    Date:  08/14/2018   ID:  AMARACHI KOTZ, DOB 08-09-1946, MRN [id number]  PCP:  Aletha Halim., PA-C  Cardiologist:  Fransico Him, MD   Chief Complaint  Patient presents with  . New Patient (Initial Visit)    preop cardiac clearance, LE edema    History of Present Illness:  Nicole [name] is a 73 y.o. female who is being seen today for the evaluation of LE edema and HTN at the request of Aletha Halim., PA-C.  This is a 72yo obese female with a history of diabetes mellitus, hypertension, osteoarthritis of the right knee status post total knee replacement in April 2019 who was referred here for preoperative evaluation for back surgery as well as evaluation of bilateral lower extremity edema.  Patient states she never had swelling her legs before but over the past couple of months since her knee replacement she has had bilateral lower extremity edema.  She has chronic back pain due to spinal stenosis and has to recently start sleeping in a recliner.  She says because of the way her back hurts she cannot fully recline her chair so her legs are dangling some when she sleeps.  Her primary put her on HCTZ but that did not help with the lower extremity edema it improved some but did not take it completely away.  She was switched to Lasix but due to persistently low potassium levels this was stopped.  She is now back on HCTZ.  She tells me that her PCP prescribed compression hose but she is not really want to use those.  She is able to continue doing her daily activities despite spinal stenosis and says she is able to walk 4 blocks without any chest pain or pressure or shortness of breath.  She has chronic back pain from her spinal stenosis with sciatica.  She denies any palpitations, dizziness, presyncope, PND orthopnea.  In asking her about her diet she says that she does not add table salt to her food but she does eat foods that are heavy in sodium such as  potato chips and also eats out very frequently.    Past Medical History:  Diagnosis Date  . Diabetes mellitus    dx 5 - 6 yrs ago.  Marland Kitchen Hypertension   . Primary localized osteoarthritis of right knee 03/08/2018    Past Surgical History:  Procedure Laterality Date  . ABDOMINAL HYSTERECTOMY    . TOTAL KNEE ARTHROPLASTY Right 03/20/2018   Procedure: RIGHT TOTAL KNEE ARTHROPLASTY;  Surgeon: Elsie Saas, MD;  Location: [facility];  Service: Orthopedics;  Laterality: Right;    Current Medications: Current Meds  Medication Sig  . atenolol (TENORMIN) 25 MG tablet Take 25 mg by mouth daily.  . Biotin 5000 MCG TABS Take 1 tablet by mouth daily.  . Calcium-Vitamin D-Vitamin K 500-100-40 MG-UNT-MCG CHEW Chew 2 each by mouth daily.  . hydrochlorothiazide (HYDRODIURIL) 25 MG tablet Take 25 mg by mouth daily.  Marland Kitchen lisinopril (PRINIVIL,ZESTRIL) 20 MG tablet Take 20 mg by mouth daily.  . metFORMIN (GLUCOPHAGE-XR) 500 MG 24 hr tablet Take 500 mg by mouth daily.  . Multiple Vitamin (MULTI-VITAMINS) TABS Take 1 tablet by mouth daily.    Allergies:   Patient has no known allergies.   Social History   Socioeconomic History  . Marital status: Married    Spouse name: Not on file  . Number of children: Not on file  . Years of education:  Not on file  . Highest education level: Not on file  Occupational History  . Not on file  Social Needs  . Financial resource strain: Not on file  . Food insecurity:    Worry: Not on file    Inability: Not on file  . Transportation needs:    Medical: Not on file    Non-medical: Not on file  Tobacco Use  . Smoking status: Never Smoker  . Smokeless tobacco: Never Used  Substance and Sexual Activity  . Alcohol use: No  . Drug use: Never  . Sexual activity: Not on file  Lifestyle  . Physical activity:    Days per week: Not on file    Minutes per session: Not on file  . Stress: Not on file  Relationships  . Social connections:    Talks on phone: Not on file      Gets together: Not on file    Attends religious service: Not on file    Active member of club or organization: Not on file    Attends meetings of clubs or organizations: Not on file    Relationship status: Not on file  Other Topics Concern  . Not on file  Social History Narrative  . Not on file     Family History:  The patient's family history is not on file.   ROS:   Please see the history of present illness.    ROS All other systems reviewed and are negative.  No flowsheet data found.     PHYSICAL EXAM:   VS:  BP 120/70   Pulse 66   Ht '5\' 4"'  (1.626 m)   Wt 194 lb 12.8 oz (88.4 kg)   SpO2 99%   BMI 33.44 kg/m    GEN: Well nourished, well developed, in no acute distress  HEENT: normal  Neck: no JVD, carotid bruits, or masses Cardiac: RRR; no murmurs, rubs, or gallops.  Intact distal pulses bilaterally. 2+ pitting LE edema bilaterally Respiratory:  clear to auscultation bilaterally, normal work of breathing GI: soft, nontender, nondistended, + BS MS: no deformity or atrophy  Skin: warm and dry, no rash Neuro:  Alert and Oriented x 3, Strength and sensation are intact Psych: euthymic mood, full affect  Wt Readings from Last 3 Encounters:  08/14/18 194 lb 12.8 oz (88.4 kg)  03/20/18 192 lb (87.1 kg)  03/09/18 192 lb (87.1 kg)      Studies/Labs Reviewed:   EKG:  EKG is not ordered today.    Recent Labs: 03/09/2018: ALT 29 03/22/2018: BUN 16; Creatinine, Ser 0.54; Hemoglobin 9.9; Platelets 216; Potassium 4.3; Sodium 140   Lipid Panel No results found for: CHOL, TRIG, HDL, CHOLHDL, VLDL, LDLCALC, LDLDIRECT  Additional studies/ records that were reviewed today include:  Office notes    ASSESSMENT:    1. Lower extremity edema   2. Spinal stenosis of lumbosacral region   3. Preoperative clearance      PLAN:  In order of problems listed above:  1. Bilateral LE edema - this is likely multifactorial from underlying obesity, sedentary state as well as  sleeping in a chair at night because of her spinal stenosis.  She also is very noncompliant in following a low-sodium diet.  I have encouraged her to decrease the amount of sodium in her diet and limit eating out.  I recommended that she follow a 2000 mg or less sodium diet.  I have also encouraged her to start using her compression hose that  she has at home.  I recommended continue on HCTZ and starting her on spironolactone 25 mg daily and addition which will also help prevent hypokalemia.  I will get bilateral extremity venous Dopplers to rule out DVT although unlikely.  I will check a 2D echocardiogram to rule out cardiomyopathy.  I will have her see my physician assistant 3 weeks to make sure lower extreme edema has improved and check a be met in 1 week to make sure potassium is okay.  2.  Spinal stenosis -she is still quite active and is able to walk at least 4 blocks without any problems.  She is needing preoperative cardiac clearance to undergo spinal stenosis surgery.  3.  Preoperative cardiac clearance for spinal stenosis surgery.  She denies any exertional chest pain or pressure or shortness of breath and is able to walk at least 4 blocks without any difficulty.  Going up stairs is limiting due to her spinal stenosis.  She is able to do over 4 METS of activity without any cardiac symptoms.  She had a knee replacement under general anesthesia in April 2019 without any difficulties.  EKG in April 2019 showed normal sinus rhythm with no ST changes.  She had to be a cardiac pending for lower extremity edema and if LV function is normal I do not think further cardiac testing is necessary prior to undergoing surgery.  Her preoperative risk of major cardiac event is 0.4% based on revised cardiac risk index.   Medication Adjustments/Labs and Tests Ordered: Current medicines are reviewed at length with the patient today.  Concerns regarding medicines are outlined above.  Medication changes, Labs and Tests  ordered today are listed in the Patient Instructions below.  Patient Instructions  Medication Instructions:  Start: Spironolactone 25 mg, daily, by mouth  Labwork: Future: 1 week BMET  Testing/Procedures: Your physician has requested that you have an echocardiogram. Echocardiography is a painless test that uses sound waves to create images of your heart. It provides your doctor with information about the size and shape of your heart and how well your heart's chambers and valves are working. This procedure takes approximately one hour. There are no restrictions for this procedure.  Your physician has requested that you have a bilateral lower extremity arterial  duplex. During this test anultrasound are used to evaluate arterial blood flow in the legs. Allow one hour for this exam. There are no restrictions or special instructions.  Follow-Up: Your physician recommends that you schedule a follow-up appointment in: 3 weeks with PA.  If you need a refill on your cardiac medications before your next appointment, please call your pharmacy.      Signed, Fransico Him, MD  08/14/2018 11:00 AM    [facility] [address], [place], [place]  [address] Phone: [phone]; Fax: [phone]

## 2018-08-21 ENCOUNTER — Ambulatory Visit (HOSPITAL_COMMUNITY)
Admission: RE | Admit: 2018-08-21 | Discharge: 2018-08-21 | Disposition: A | Discharge status: Home / Self Care | Payer: Medicare Other | Source: Ambulatory Visit | Attending: Cardiovascular Disease | Admitting: Cardiovascular Disease

## 2018-08-21 ENCOUNTER — Ambulatory Visit (HOSPITAL_BASED_OUTPATIENT_CLINIC_OR_DEPARTMENT_OTHER): Payer: Medicare Other

## 2018-08-21 ENCOUNTER — Other Ambulatory Visit: Payer: Medicare Other | Admitting: *Deleted

## 2018-08-21 ENCOUNTER — Other Ambulatory Visit: Payer: Self-pay

## 2018-08-21 ENCOUNTER — Telehealth: Payer: Self-pay | Admitting: Cardiology

## 2018-08-21 DIAGNOSIS — R600 Localized edema: Secondary | ICD-10-CM

## 2018-08-21 DIAGNOSIS — I700 Atherosclerosis of aorta: Secondary | ICD-10-CM

## 2018-08-21 LAB — BASIC METABOLIC PANEL
BUN/Creatinine Ratio: 26 (ref 12–28)
BUN: 17 mg/dL (ref 8–27)
CO2: 27 mmol/L (ref 20–29)
Calcium: 12.3 mg/dL — ABNORMAL HIGH (ref 8.7–10.3)
Chloride: 100 mmol/L (ref 96–106)
Creatinine, Ser: 0.65 mg/dL (ref 0.57–1.00)
GFR calc Af Amer: 103 mL/min/{1.73_m2} (ref >59)
GFR calc non Af Amer: 89 mL/min/{1.73_m2} (ref >59)
Glucose: 152 mg/dL — ABNORMAL HIGH (ref 65–99)
Potassium: 4.3 mmol/L (ref 3.5–5.2)
Sodium: 143 mmol/L (ref 134–144)

## 2018-08-21 NOTE — Telephone Encounter (Signed)
Spoke with the patient regarding her echo result. She expressed understanding and had no further questions. An order for a chest CT was placed and a message sent to scheduling.

## 2018-08-24 ENCOUNTER — Telehealth: Payer: Self-pay

## 2018-08-24 NOTE — Telephone Encounter (Signed)
-----   Message from Nuala Alpha, LPN sent at 60/04/7702  8:13 AM EDT -----   ----- Message ----- From: Sueanne Margarita, MD Sent: 08/21/2018   8:10 PM EDT To: [facility] Triage  Hypercalcemia - please get an inonized calcium and forward to PCP ASAP for further workup

## 2018-08-24 NOTE — Telephone Encounter (Signed)
Spoke with the patient about her lab results. She stated that her calcium is chronically elevated. She accepted the ionized calcium lab on 10/4. Sent to PCP for review.

## 2018-08-25 ENCOUNTER — Other Ambulatory Visit: Payer: Medicare Other | Admitting: *Deleted

## 2018-08-25 LAB — CALCIUM: Calcium: 11.2 mg/dL — ABNORMAL HIGH (ref 8.7–10.3)

## 2018-08-28 ENCOUNTER — Telehealth: Payer: Self-pay

## 2018-08-28 NOTE — Telephone Encounter (Signed)
Spoke with the patient and scheduled the ionized calcium on 10/10.

## 2018-08-28 NOTE — Telephone Encounter (Signed)
Awaiting patient to return call.    ----- Message ----- From: Sueanne Margarita, MD Sent: 08/21/2018   8:10 PM EDT To: [facility] Triage  Hypercalcemia - please get an inonized calcium and forward to PCP ASAP for further workup

## 2018-08-29 ENCOUNTER — Encounter: Payer: Self-pay | Admitting: *Deleted

## 2018-08-31 ENCOUNTER — Other Ambulatory Visit: Payer: Medicare Other | Admitting: *Deleted

## 2018-08-31 ENCOUNTER — Ambulatory Visit (INDEPENDENT_AMBULATORY_CARE_PROVIDER_SITE_OTHER)
Admission: RE | Admit: 2018-08-31 | Discharge: 2018-08-31 | Disposition: A | Discharge status: Home / Self Care | Payer: Medicare Other | Source: Ambulatory Visit | Attending: Cardiology | Admitting: Cardiology

## 2018-08-31 DIAGNOSIS — I700 Atherosclerosis of aorta: Secondary | ICD-10-CM | POA: Diagnosis not present

## 2018-08-31 MED ORDER — IOPAMIDOL (ISOVUE-370) INJECTION 76%
100.0000 mL | Freq: Once | INTRAVENOUS | Status: AC | PRN
  Administered 2018-08-31: 100 mL via INTRAVENOUS

## 2018-09-01 LAB — CALCIUM, IONIZED: Calcium, Ion: 5.8 mg/dL — ABNORMAL HIGH (ref 4.5–5.6)

## 2018-09-06 ENCOUNTER — Telehealth: Payer: Self-pay

## 2018-09-06 DIAGNOSIS — J9819 Other pulmonary collapse: Secondary | ICD-10-CM

## 2018-09-06 NOTE — Telephone Encounter (Signed)
Spoke with the patient about her ionized calcium and chest CTA results. The patient accepted that PCP will follow up on calcium levels and a referral for pulmonology was ordered. [facility] and they are following up to make certain the patient gets scheduled. Advised the patient that the spinal surgery would need to be on hold until she recovers from the collapsed lung.       Notes recorded by Sueanne Margarita, MD on 09/02/2018 at 6:56 PM EDT Please let patient know that chest CT showed normal caliber aorta with no atherosclerosis or calcification. The right diaphragm is elevated with possible paresis and RML lung collapse - please set up pulmonary consult ASAP this week and spinal stenosis surgery needs to be placed on hold.

## 2018-09-12 DIAGNOSIS — R600 Localized edema: Secondary | ICD-10-CM | POA: Insufficient documentation

## 2018-09-12 DIAGNOSIS — J9819 Other pulmonary collapse: Secondary | ICD-10-CM | POA: Insufficient documentation

## 2018-09-12 NOTE — Progress Notes (Signed)
Cardiology Office Note    Date:  09/13/2018   ID:  Nicole [name], DOB 1946/05/26, MRN [id number]  PCP:  Aletha Halim., PA-C  Cardiologist: Fransico Him, MD EPS: None  Chief Complaint  Patient presents with  . Follow-up    History of Present Illness:  Nicole [name] is a 72 y.o. female with history of hypertension, spinal stenosis, bilateral lower extremity edema who Dr. Radford Pax saw on 08/14/2018 for preoperative clearance for spinal surgery.  Lower extremity edema was felt to be multifactorial secondary to obesity, sedentary state and sleeping in a chair at night because of spinal stenosis.  She was encouraged to follow 2 g sodium diet, continue HCTZ and was started on Spironolactone.2D echo 08/21/2018 showed normal LV function with mild LVH and grade 1 DD with atheroma seen in the proximal aortic arch.  No DVT on venous Dopplers.  Dr. Radford Pax ordered chest CT angios showed normal diameter of the aorta with no atheromatous changes and no noted aortic valvular calcification.  She did have an elevated right diaphragm possible paresis with right middle lobe collapse.  She was referred to pulmonary and spinal surgery was put on hold.  Calcium elevated at 11.2 and ionized calcium was 5.8.  Patient comes in today for follow-up.  Her leg swelling has greatly improved with Spironolactone.  She denies any dyspnea.  She has some dyspnea on exertion that she attributes to her weight and has been chronic and not changed.  She is never smoked.  She is never had a chest x-ray.  Her O2 sats are 100%.   Past Medical History:  Diagnosis Date  . Diabetes mellitus    dx 5 - 6 yrs ago.  Marland Kitchen Hypertension   . Primary localized osteoarthritis of right knee 03/08/2018    Past Surgical History:  Procedure Laterality Date  . ABDOMINAL HYSTERECTOMY    . TOTAL KNEE ARTHROPLASTY Right 03/20/2018   Procedure: RIGHT TOTAL KNEE ARTHROPLASTY;  Surgeon: Elsie Saas, MD;  Location: [facility];  Service:  Orthopedics;  Laterality: Right;    Current Medications: Current Meds  Medication Sig  . atenolol (TENORMIN) 25 MG tablet Take 25 mg by mouth daily.  . Biotin 5000 MCG TABS Take 1 tablet by mouth daily.  . hydrochlorothiazide (HYDRODIURIL) 25 MG tablet Take 25 mg by mouth daily.  Marland Kitchen lisinopril (PRINIVIL,ZESTRIL) 10 MG tablet Take 1 tablet (10 mg total) by mouth daily.  . metFORMIN (GLUCOPHAGE-XR) 500 MG 24 hr tablet Take 500 mg by mouth daily.  . Multiple Vitamin (MULTI-VITAMINS) TABS Take 1 tablet by mouth daily.  Marland Kitchen spironolactone (ALDACTONE) 25 MG tablet Take 1 tablet (25 mg total) by mouth daily.  . [DISCONTINUED] lisinopril (PRINIVIL,ZESTRIL) 20 MG tablet Take 20 mg by mouth daily.     Allergies:   Patient has no known allergies.   Social History   Socioeconomic History  . Marital status: Married    Spouse name: Not on file  . Number of children: Not on file  . Years of education: Not on file  . Highest education level: Not on file  Occupational History  . Not on file  Social Needs  . Financial resource strain: Not on file  . Food insecurity:    Worry: Not on file    Inability: Not on file  . Transportation needs:    Medical: Not on file    Non-medical: Not on file  Tobacco Use  . Smoking status: Never Smoker  . Smokeless tobacco: Never  Used  Substance and Sexual Activity  . Alcohol use: No  . Drug use: Never  . Sexual activity: Not on file  Lifestyle  . Physical activity:    Days per week: Not on file    Minutes per session: Not on file  . Stress: Not on file  Relationships  . Social connections:    Talks on phone: Not on file    Gets together: Not on file    Attends religious service: Not on file    Active member of club or organization: Not on file    Attends meetings of clubs or organizations: Not on file    Relationship status: Not on file  Other Topics Concern  . Not on file  Social History Narrative  . Not on file     Family History:  The  patient's family history includes Hypertension in her mother; Leukemia in her father; Obesity in her sister.   ROS:   Please see the history of present illness.    Review of Systems  Constitution: Negative.  HENT: Negative.   Eyes: Negative.   Cardiovascular: Positive for leg swelling.  Respiratory: Negative.   Hematologic/Lymphatic: Negative.   Musculoskeletal: Positive for back pain, joint pain and stiffness.  Gastrointestinal: Negative.   Genitourinary: Negative.   Neurological: Negative.    All other systems reviewed and are negative.   PHYSICAL EXAM:   VS:  BP 100/60 (BP Location: Left Arm, Patient Position: Sitting, Cuff Size: Large)   Pulse 74   Ht 5\' 3"  (1.6 m)   Wt 194 lb 12.8 oz (88.4 kg)   SpO2 100% Comment: at rest  BMI 34.51 kg/m   Physical Exam  GEN: Obese, in no acute distress   Neck: no JVD, carotid bruits, or masses Cardiac:RRR; no murmurs, rubs, or gallops  Respiratory:  clear to auscultation bilaterally, normal work of breathing GI: soft, nontender, nondistended, + BS Ext: without cyanosis, clubbing, or edema, Good distal pulses bilaterally Neuro:  Alert and Oriented x 3 Psych: euthymic mood, full affect  Wt Readings from Last 3 Encounters:  09/13/18 194 lb 12.8 oz (88.4 kg)  08/14/18 194 lb 12.8 oz (88.4 kg)  03/20/18 192 lb (87.1 kg)      Studies/Labs Reviewed:   EKG:  EKG is not ordered today.   Recent Labs: 03/09/2018: ALT 29 03/22/2018: Hemoglobin 9.9; Platelets 216 08/21/2018: BUN 17; Creatinine, Ser 0.65; Potassium 4.3; Sodium 143   Lipid Panel No results found for: CHOL, TRIG, HDL, CHOLHDL, VLDL, LDLCALC, LDLDIRECT  Additional studies/ records that were reviewed today include:  2D echo 08/21/2018 Study Conclusions   - Left ventricle: The cavity size was normal. There was moderate   focal basal and mild concentric hypertrophy of the septum.   Systolic function was normal. The estimated ejection fraction was   in the range of 55% to  60%. Although no diagnostic regional wall   motion abnormality was identified, this possibility cannot be   completely excluded on the basis of this study due to   foreshortening of images in several views. Doppler parameters are   consistent with abnormal left ventricular relaxation (grade 1   diastolic dysfunction). - Aortic valve: There was mild to moderate regurgitation. 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 - Mitral valve: There was trivial regurgitation. - Right ventricle: Systolic function was normal. - Tricuspid valve: There was mild regurgitation. - Pulmonic valve: There was no significant regurgitation. -  Pulmonary arteries: Systolic pressure was mildly increased. PA   peak pressure: 34 mm Hg (S).   Impressions:   - Normal LV EF without obvious wall motion abnormalities. Grade 1   diastolic dysfunction. 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  CT of the chest 09/01/2018 IMPRESSION: 1. Normal diameter aorta with no noted atheromatous changes. No noted aortic valvular calcification. 2. 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 3. Hepatic steatosis and cholelithiasis.     Electronically Signed   By: Monte Fantasia M.D.   On: 09/01/2018 08:17      ASSESSMENT:    1. Essential hypertension   2. Lower extremity edema   3. Collapsed lung   4. Spinal stenosis of lumbosacral region      PLAN:  In order of problems listed above:  Lower extremity edema felt to be multifactorial secondary to obesity, sleeping in a chair because of her spinal stenosis and sedentary state.  Started on HCTZ and Spironolactone.  2D echo with normal LV function and grade 1 DD.  Question of atheroma seen in the proximal aortic arch so CT ordered which showed normal diameter aorta with no atheromatous changes.  She did have an elevated right hemidiaphragm with right middle lobe lung  collapse.  Swelling has improved on current medications.  Continue Spironolactone.    Lung collapse of the middle lobe found incidentally on CT scan.Referred to pulmonary and will be seen today.  Lung exam is completely clear and O2 sats are 100% which do not match CT scan. spinal stenosis surgery postponed all cleared by pulmonary..  Essential hypertension blood pressure on the low side.  Will decrease lisinopril to 10 mg daily.  Hypercalcemia recommend stopping her calcium supplement and follow-up with PCP.  Medication Adjustments/Labs and Tests Ordered: Current medicines are reviewed at length with the patient today.  Concerns regarding medicines are outlined above.  Medication changes, Labs and Tests ordered today are listed in the Patient Instructions below. There are no Patient Instructions on file for this visit.   Sumner Boast, PA-C  09/13/2018 9:33 AM    [facility] [address], [place], [place]  [address] Phone: [phone]; Fax: [phone]

## 2018-09-13 ENCOUNTER — Encounter: Payer: Self-pay | Admitting: Pulmonary Disease

## 2018-09-13 ENCOUNTER — Encounter: Payer: Self-pay | Admitting: Physician Assistant

## 2018-09-13 ENCOUNTER — Ambulatory Visit (INDEPENDENT_AMBULATORY_CARE_PROVIDER_SITE_OTHER): Payer: Medicare Other | Admitting: Pulmonary Disease

## 2018-09-13 ENCOUNTER — Ambulatory Visit (INDEPENDENT_AMBULATORY_CARE_PROVIDER_SITE_OTHER): Payer: Medicare Other | Admitting: Physician Assistant

## 2018-09-13 VITALS — BP 120/72 | HR 78 | Ht 63.0 in | Wt 193.6 lb

## 2018-09-13 VITALS — BP 100/60 | HR 74 | Ht 63.0 in | Wt 194.8 lb

## 2018-09-13 DIAGNOSIS — I10 Essential (primary) hypertension: Secondary | ICD-10-CM | POA: Diagnosis not present

## 2018-09-13 DIAGNOSIS — R600 Localized edema: Secondary | ICD-10-CM

## 2018-09-13 DIAGNOSIS — M4807 Spinal stenosis, lumbosacral region: Secondary | ICD-10-CM | POA: Diagnosis not present

## 2018-09-13 DIAGNOSIS — J986 Disorders of diaphragm: Secondary | ICD-10-CM | POA: Diagnosis not present

## 2018-09-13 DIAGNOSIS — J9819 Other pulmonary collapse: Secondary | ICD-10-CM

## 2018-09-13 MED ORDER — LISINOPRIL 10 MG PO TABS: 10.0000 mg | ORAL_TABLET | Freq: Every day | ORAL | 3 refills | Status: DC

## 2018-09-13 NOTE — Patient Instructions (Signed)
Medication Instructions:  Your physician has recommended you make the following change in your medication:   DECREASE: lisinopril to 10 mg once a day   If you need a refill on your cardiac medications before your next appointment, please call your pharmacy.   Lab work: None Ordered  If you have labs (blood work) drawn today and your tests are completely normal, you will receive your results only by: Marland Kitchen MyChart Message (if you have MyChart) OR . A paper copy in the mail If you have any lab test that is abnormal or we need to change your treatment, we will call you to review the results.  Testing/Procedures: None ordered  Follow-Up: Follow up with Dr. Ander Slade at [facility] at 4:15 PM. Please arrive at 4:00 PM.  At [facility], you and your health needs are our priority.  As part of our continuing mission to provide you with exceptional heart care, we have created designated Provider Care Teams.  These Care Teams include your primary Cardiologist (physician) and Advanced Practice Providers (APPs -  Physician Assistants and Nurse Practitioners) who all work together to provide you with the care you need, when you need it. . You will need a follow up appointment in 6 months.  Please call our office 2 months in advance to schedule this appointment.  You may see Fransico Him, MD or one of the following Advanced Practice Providers on your designated Care Team:   . Lyda Jester, PA-C . Dayna Dunn, PA-C . Ermalinda Barrios, PA-C  Any Other Special Instructions Will Be Listed Below (If Applicable).

## 2018-09-13 NOTE — Progress Notes (Signed)
Nicole [name]    [id number]    04-16-1946  Primary Care Physician:Kaplan, Baldemar Friday., PA-C  Referring Physician: Aletha Halim., PA-C [address] [place] [address]  Chief complaint:   Patient is being seen for an abnormal CT scan of the chest showing an elevated hemidiaphragm on the right Has no symptoms  HPI:  CT was performed while patient was being evaluated for back surgery, she was given a cardiac work-up She denies any significant shortness of breath No previous history of upper back surgery Has a history of spinal stenosis She recently had surgery knee at the end of April which she tolerated well She has no previous chest x-rays to refer to She is not short of breath with usual activities  No history of cough  History of hypertension, diabetes, obesity  Occupation: No pertinent Smoking history: Never smoker  Outpatient Encounter Medications as of 09/13/2018  Medication Sig  . atenolol (TENORMIN) 25 MG tablet Take 25 mg by mouth daily.  . Biotin 5000 MCG TABS Take 1 tablet by mouth daily.  . hydrochlorothiazide (HYDRODIURIL) 25 MG tablet Take 25 mg by mouth daily.  Marland Kitchen lisinopril (PRINIVIL,ZESTRIL) 10 MG tablet Take 1 tablet (10 mg total) by mouth daily.  . metFORMIN (GLUCOPHAGE-XR) 500 MG 24 hr tablet Take 500 mg by mouth daily.  . Multiple Vitamin (MULTI-VITAMINS) TABS Take 1 tablet by mouth daily.  Marland Kitchen spironolactone (ALDACTONE) 25 MG tablet Take 1 tablet (25 mg total) by mouth daily.   No facility-administered encounter medications on file as of 09/13/2018.     Allergies as of 09/13/2018  . (No Known Allergies)    Past Medical History:  Diagnosis Date  . Diabetes mellitus    dx 5 - 6 yrs ago.  Marland Kitchen Hypertension   . Primary localized osteoarthritis of right knee 03/08/2018    Past Surgical History:  Procedure Laterality Date  . ABDOMINAL HYSTERECTOMY    . 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 Hypertension Mother   . Leukemia Father   . Obesity Sister     Social History   Socioeconomic History  . Marital status: Married    Spouse name: Not on file  . Number of children: Not on file  . Years of education: Not on file  . Highest education level: Not on file  Occupational History  . Not on file  Social Needs  . Financial resource strain: Not on file  . Food insecurity:    Worry: Not on file    Inability: Not on file  . Transportation needs:    Medical: Not on file    Non-medical: Not on file  Tobacco Use  . Smoking status: Never Smoker  . Smokeless tobacco: Never Used  Substance and Sexual Activity  . Alcohol use: No  . Drug use: Never  . Sexual activity: Not on file  Lifestyle  . Physical activity:    Days per week: Not on file    Minutes per session: Not on file  . Stress: Not on file  Relationships  . Social connections:    Talks on phone: Not on file    Gets together: Not on file    Attends religious service: Not on file    Active member  of club or organization: Not on file    Attends meetings of clubs or organizations: Not on file    Relationship status: Not on file  . Intimate partner violence:    Fear of current or ex partner: Not on file    Emotionally abused: Not on file    Physically abused: Not on file    Forced sexual activity: Not on file  Other Topics Concern  . Not on file  Social History Narrative  . Not on file    Review of Systems  Constitutional: Negative.   HENT: Negative.   Respiratory: Negative for apnea.   Cardiovascular: Positive for leg swelling. Negative for chest pain.  Gastrointestinal: Negative.   Genitourinary: Negative.   Musculoskeletal: Positive for back pain.  Skin: Negative.     Vitals:   09/13/18 1617  BP: 120/72  Pulse: 78  SpO2: 98%      Physical Exam  Constitutional: She is oriented to person, place, and time. She appears well-developed and well-nourished.  HENT:  Head: Normocephalic.  Eyes: Right eye exhibits no discharge. Left eye exhibits no discharge.  Neck: Normal range of motion. Neck supple. No tracheal deviation present. No thyromegaly present.  Cardiovascular: Normal rate and regular rhythm. Exam reveals no friction rub.  No murmur heard. Pulmonary/Chest: Effort normal. No respiratory distress. She has no wheezes.  Decreased air movement at right base  Abdominal: Soft. Bowel sounds are normal. She exhibits no distension. There is no tenderness.  Musculoskeletal: Normal range of motion.  Neurological: She is oriented to person, place, and time. No cranial nerve deficit. Coordination normal.  Skin: Skin is warm and dry. No erythema.   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 Asymptomatic at present No history of lung disease, never smoker She did have surgery end of April which he tolerated well Thyroid nodule  Plan/Recommendations: We will obtain a sniff test  Obtain a pulmonary function study  Following above we will plan for bronchoscopy to rule out any endobronchial lesion  It is likely that she is had a paralyzed hemidiaphragm for a long time, she has no findings suggesting a growth or any other process compressing the phrenic nerve  She does have a thyroid nodule that is unrelated-needs further work-up  She is asymptomatic at the present time if no significant abnormalities found on endoscopy-good to go for surgery She has been asymptomatic with her decreased lung volumes   Sherrilyn Rist MD [facility] 09/13/2018, 5:14 PM  CC: Aletha Halim., PA-C

## 2018-09-13 NOTE — Patient Instructions (Signed)
Paralyzed right hemidiaphragm--this is likely a very chronic process as you have had no symptoms  Obtain a sniff test Obtain a full pulmonary function study  Following reviewing the above, we will plan for bronchoscopy to rule out endobronchial process  We will see you back in the office in about 6 weeks We will wait for above prior to scheduling your back surgery

## 2018-09-26 ENCOUNTER — Ambulatory Visit: Payer: Medicare Other | Admitting: Cardiovascular Disease

## 2018-09-26 ENCOUNTER — Encounter

## 2018-10-04 ENCOUNTER — Ambulatory Visit (HOSPITAL_COMMUNITY)
Admission: RE | Admit: 2018-10-04 | Discharge: 2018-10-04 | Disposition: A | Discharge status: Home / Self Care | Payer: Medicare Other | Source: Ambulatory Visit | Attending: Pulmonary Disease | Admitting: Pulmonary Disease

## 2018-10-04 ENCOUNTER — Ambulatory Visit (HOSPITAL_COMMUNITY): Payer: 59

## 2018-10-04 DIAGNOSIS — J986 Disorders of diaphragm: Secondary | ICD-10-CM | POA: Diagnosis not present

## 2018-10-17 ENCOUNTER — Telehealth: Payer: Self-pay | Admitting: Pulmonary Disease

## 2018-10-17 NOTE — Telephone Encounter (Signed)
Patient is aware and apt made nothing further needed at this time.

## 2018-10-17 NOTE — Telephone Encounter (Signed)
lmom to call back 

## 2018-10-17 NOTE — Telephone Encounter (Signed)
Pt is calling back [phone]

## 2018-10-17 NOTE — Telephone Encounter (Signed)
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

## 2018-10-17 NOTE — Telephone Encounter (Signed)
Spoke with pt. She is requesting her Sniff Test results.  Dr. Ander Slade - please advise. Thanks.

## 2018-10-25 ENCOUNTER — Ambulatory Visit (INDEPENDENT_AMBULATORY_CARE_PROVIDER_SITE_OTHER): Payer: Medicare Other | Admitting: Pulmonary Disease

## 2018-10-25 ENCOUNTER — Encounter: Payer: Self-pay | Admitting: Pulmonary Disease

## 2018-10-25 VITALS — BP 134/72 | HR 75 | Ht 63.0 in | Wt 195.2 lb

## 2018-10-25 DIAGNOSIS — R9389 Abnormal findings on diagnostic imaging of other specified body structures: Secondary | ICD-10-CM | POA: Diagnosis not present

## 2018-10-25 NOTE — Patient Instructions (Signed)
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

## 2018-10-25 NOTE — Progress Notes (Signed)
Nicole [name]    [id number]    1946-09-20  Primary Care Physician:Kaplan, Baldemar Friday., PA-C  Referring Physician: Aletha Halim., PA-C [address] [place] [address]  Chief complaint:   Patient was initially seen for an abnormal CT scan of the chest showing an elevated hemidiaphragm on the right Has no symptoms-no limitations  HPI: Denies any significant symptoms at the present time, denies any shortness of breath, no significant limitations with respect to activities of daily living Denies any shortness of breath on laying down  CT was performed while patient was being evaluated for back surgery, she was given a cardiac work-up She denies any significant shortness of breath No previous history of upper back surgery Has a history of spinal stenosis She recently had surgery knee at the end of April which she tolerated well She has no previous chest x-rays to refer to She is not short of breath with usual activities  No history of cough  History of hypertension, diabetes, obesity  Occupation: No pertinent Smoking history: Never smoker  Outpatient Encounter Medications as of 10/25/2018  Medication Sig  . atenolol (TENORMIN) 25 MG tablet Take 25 mg by mouth daily.  . Biotin 5000 MCG TABS Take 1 tablet by mouth daily.  . hydrochlorothiazide (HYDRODIURIL) 25 MG tablet Take 25 mg by mouth daily.  Marland Kitchen lisinopril (PRINIVIL,ZESTRIL) 10 MG tablet Take 1 tablet (10 mg total) by mouth daily.  . metFORMIN (GLUCOPHAGE-XR) 500 MG 24 hr tablet Take 500 mg by mouth daily.  . Multiple Vitamin (MULTI-VITAMINS) TABS Take 1 tablet by mouth daily.  Marland Kitchen spironolactone (ALDACTONE) 25 MG tablet Take 1 tablet (25 mg total) by mouth daily.   No facility-administered encounter medications on file as of 10/25/2018.     Allergies as of 10/25/2018  . (No Known Allergies)    Past Medical History:  Diagnosis Date  . Diabetes mellitus    dx 5 - 6 yrs ago.  Marland Kitchen Hypertension     . Primary localized osteoarthritis of right knee 03/08/2018    Past Surgical History:  Procedure Laterality Date  . ABDOMINAL HYSTERECTOMY    . TOTAL KNEE ARTHROPLASTY Right 03/20/2018   Procedure: RIGHT TOTAL KNEE ARTHROPLASTY;  Surgeon: Elsie Saas, MD;  Location: [facility];  Service: Orthopedics;  Laterality: Right;    Family History  Problem Relation Age of Onset  . Hypertension Mother   . Leukemia Father   . Obesity Sister     Social History   Socioeconomic History  . Marital status: Married    Spouse name: Not on file  . Number of children: Not on file  . Years of education: Not on file  . Highest education level: Not on file  Occupational History  . Not on file  Social Needs  . Financial resource strain: Not on file  . Food insecurity:    Worry: Not on file    Inability: Not on file  . Transportation needs:    Medical: Not on file    Non-medical: Not on file  Tobacco Use  . Smoking status: Never Smoker  . Smokeless tobacco: Never Used  Substance and Sexual Activity  . Alcohol use: No  . Drug use: Never  . Sexual activity: Not on file  Lifestyle  . Physical activity:    Days per week: Not on file    Minutes per session: Not on file  . Stress: Not on file  Relationships  .  Social connections:    Talks on phone: Not on file    Gets together: Not on file    Attends religious service: Not on file    Active member of club or organization: Not on file    Attends meetings of clubs or organizations: Not on file    Relationship status: Not on file  . Intimate partner violence:    Fear of current or ex partner: Not on file    Emotionally abused: Not on file    Physically abused: Not on file    Forced sexual activity: Not on file  Other Topics Concern  . Not on file  Social History Narrative  . Not on file    Review of Systems  Constitutional: Negative.   HENT: Negative.   Respiratory: Negative for apnea.   Cardiovascular: Negative for chest pain and  leg swelling.  Gastrointestinal: Negative.   Genitourinary: Negative.   Musculoskeletal: Positive for back pain.  Skin: Negative.   Psychiatric/Behavioral: Negative for sleep disturbance.    Vitals:   10/25/18 1006  BP: 134/72  Pulse: 75  SpO2: 97%     Physical Exam  Constitutional: She appears well-developed and well-nourished.  HENT:  Head: Normocephalic.  Eyes: Right eye exhibits no discharge. Left eye exhibits no discharge.  Neck: Normal range of motion. Neck supple. No tracheal deviation present. No thyromegaly present.  Cardiovascular: Normal rate and regular rhythm. Exam reveals no friction rub.  No murmur heard. Pulmonary/Chest: Effort normal. No respiratory distress. She has no wheezes.  Decreased air movement at right base  Musculoskeletal: Normal range of motion.   Data Reviewed: CT scan was reviewed with the patient showing an elevated right hemidiaphragm Sniff test is consistent with phrenic nerve palsy  Assessment:  .  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  Asymptomatic at present-has no limitations  .  No history of lung disease, never smoker  .  She did have surgery end of April which he tolerated well  .  Thyroid nodule  Plan/Recommendations:  Invasive intervention will likely not add any significant information  It is likely that she is had a paralyzed hemidiaphragm for a long time, she has no findings suggesting a growth or any other process compressing the phrenic nerve  She does have a thyroid nodule that is unrelated-needs further work-up  She has been asymptomatic with her decreased lung volumes  Clear for surgery from a pulmonary perspective, recent surgery was well-tolerated  I will see her in the office as needed Encouraged to call with any changes in symptoms   Sherrilyn Rist MD [facility] 10/25/2018, 10:24 AM  CC: Aletha Halim., PA-C

## 2019-05-13 IMAGING — RF DG SNIFF TEST
4 series · 16 of 16 positions shown · non-contrast
Comparison: Chest CT 08/31/2018.  No additional comparison studies.

CLINICAL DATA: Elevated right hemidiaphragm and right basilar
atelectasis on chest CT. Patient denies previous injury or surgery.
History of diabetes and hypertension.

EXAM:
CHEST FLUOROSCOPY
TECHNIQUE: Real-time fluoroscopic evaluation of the chest was performed.
FLUOROSCOPY TIME:  Fluoroscopy Time: 24 seconds of low-dose pulsed
fluoroscopy.
Radiation Exposure Index (if provided by the fluoroscopic device):
6.9 mGy
Number of Acquired Spot Images: 0

[Series 1: cp_chest · 0.51mm/px · 4 of 55 frames shown (1 of 4)]
[frame 9/55]
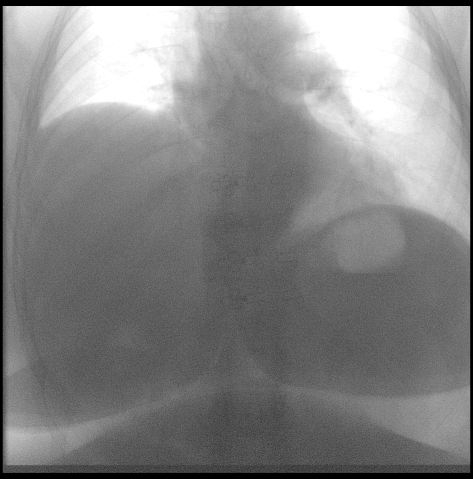
[frame 28/55]
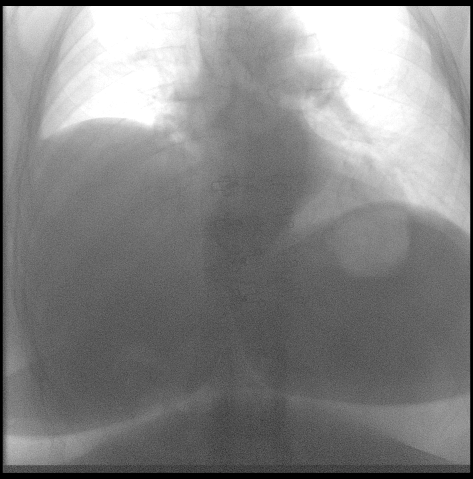
[frame 40/55]
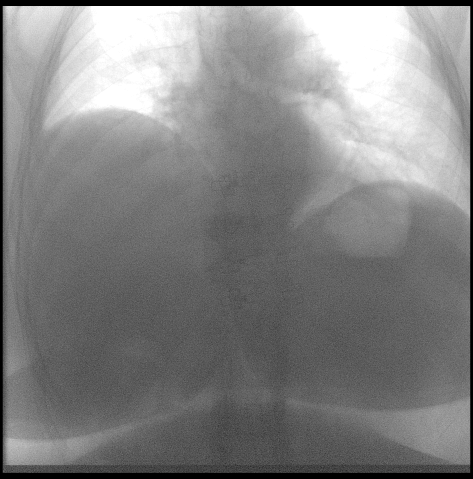
[frame 47/55]
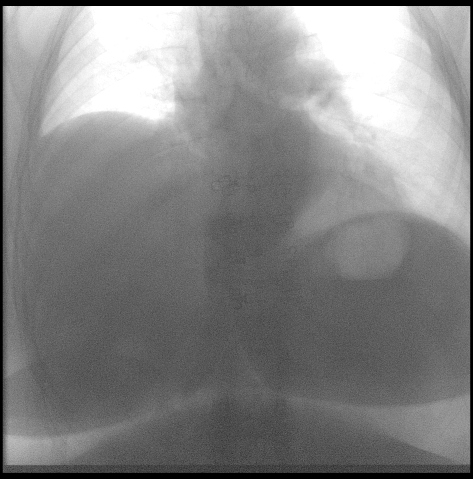

[Series 2: cp_chest · 0.51mm/px · 4 of 80 frames shown (2 of 4)]
[frame 1/80]
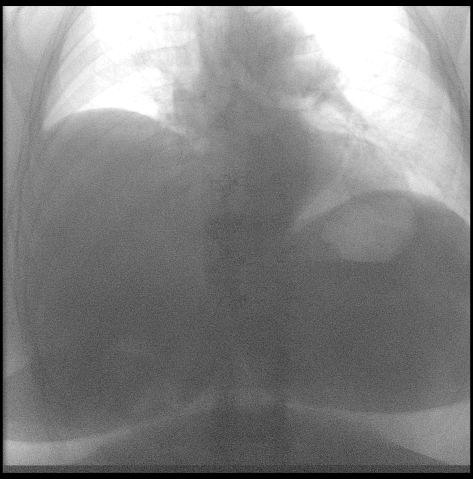
[frame 13/80]
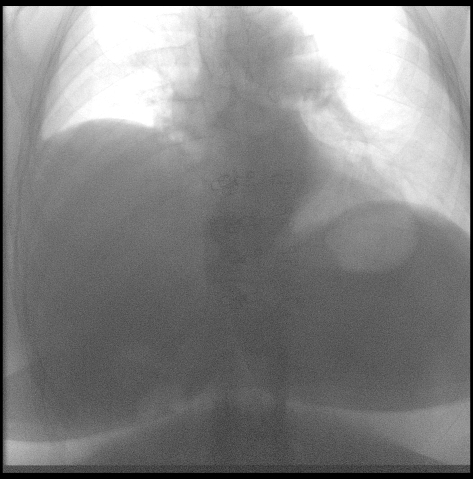
[frame 41/80]
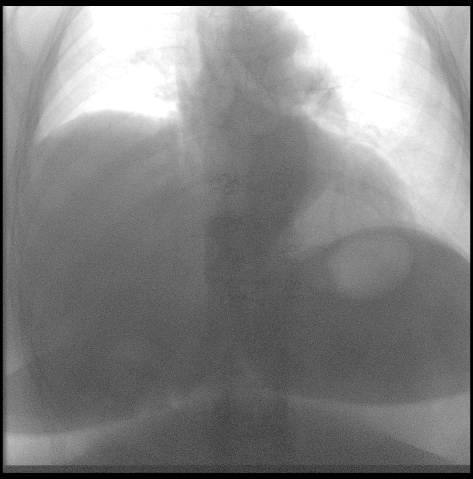
[frame 69/80]
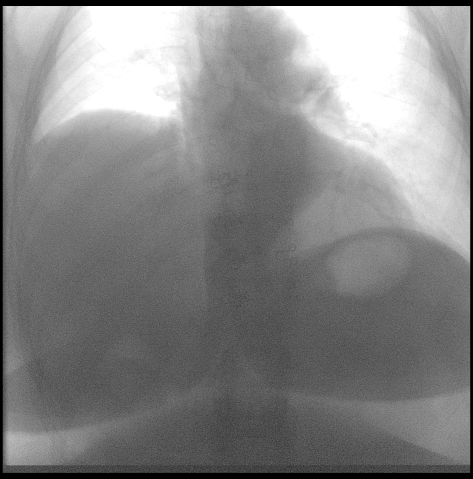

[Series 3: cp_chest · 0.51mm/px · 4 of 70 frames shown (3 of 4)]
[frame 11/70]
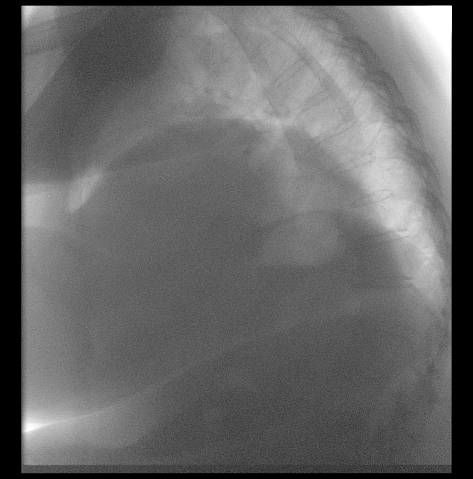
[frame 36/70]
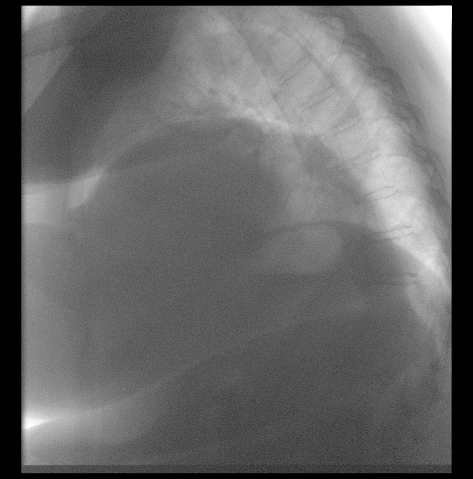
[frame 60/70]
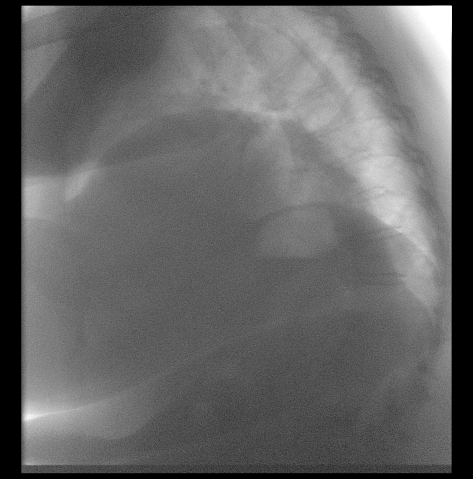
[frame 68/70]
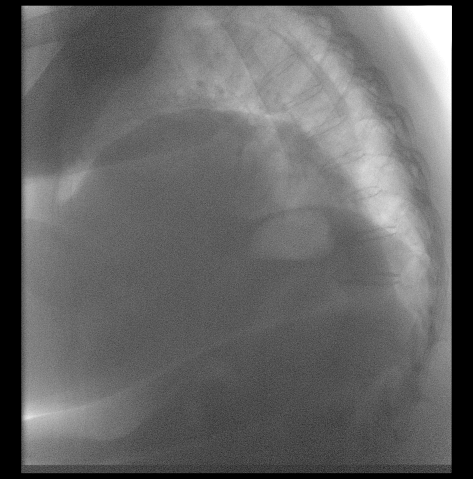

[Series 4: cp_chest · 0.51mm/px · 4 of 56 frames shown (4 of 4)]
[frame 9/56]
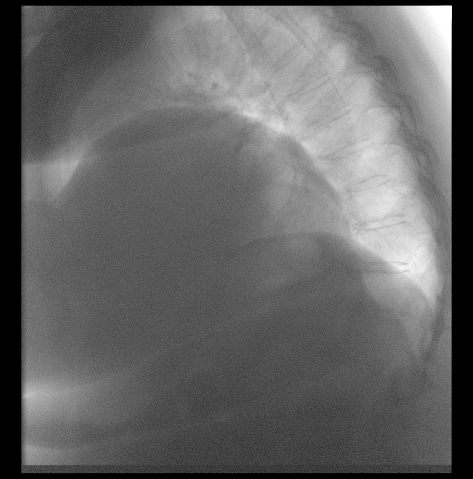
[frame 29/56]
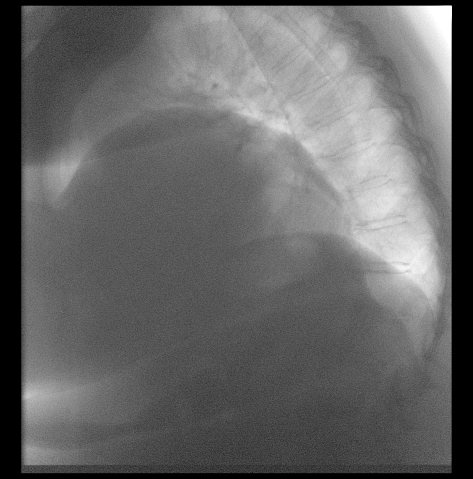
[frame 48/56]
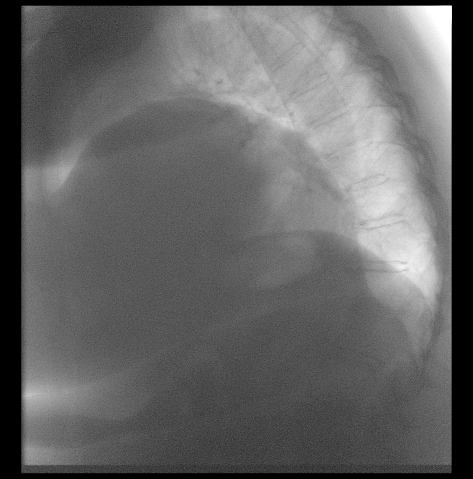
[frame 51/56]
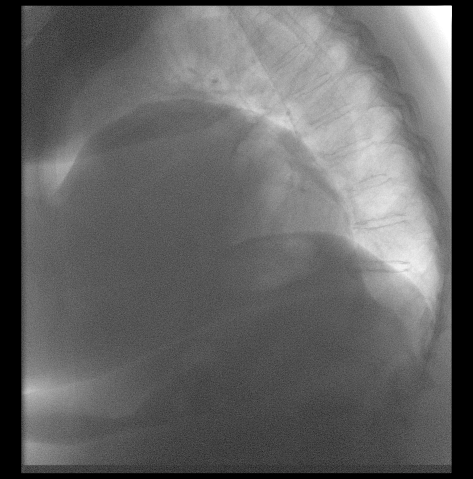

[16 of 16 positions shown; findings below may reference images not displayed]

FINDINGS: The diaphragm was evaluated in the AP and lateral projections during
normal breathing and sniffing. Real time fluoroscopic images were
recorded and labeled as such.

There is moderate elevation of the right hemidiaphragm with mild
right basilar atelectasis. During breathing and sniffing, there is
normal excursion of the left hemidiaphragm. There is minimal to no
motion of the right hemidiaphragm.
IMPRESSION: Elevated right hemidiaphragm with minimal to no excursion with
breathing and sniffing consistent with phrenic nerve palsy.

## 2019-05-25 ENCOUNTER — Emergency Department (HOSPITAL_BASED_OUTPATIENT_CLINIC_OR_DEPARTMENT_OTHER): Payer: Medicare Other

## 2019-05-25 ENCOUNTER — Emergency Department (HOSPITAL_BASED_OUTPATIENT_CLINIC_OR_DEPARTMENT_OTHER)
Admission: EM | Admit: 2019-05-25 | Discharge: 2019-05-25 | Disposition: A | Discharge status: Home / Self Care | Payer: Medicare Other | Attending: Emergency Medicine | Admitting: Emergency Medicine

## 2019-05-25 ENCOUNTER — Other Ambulatory Visit: Payer: Self-pay

## 2019-05-25 ENCOUNTER — Encounter (HOSPITAL_BASED_OUTPATIENT_CLINIC_OR_DEPARTMENT_OTHER): Payer: Self-pay

## 2019-05-25 DIAGNOSIS — W1839XA Other fall on same level, initial encounter: Secondary | ICD-10-CM | POA: Diagnosis not present

## 2019-05-25 DIAGNOSIS — Z79899 Other long term (current) drug therapy: Secondary | ICD-10-CM | POA: Diagnosis not present

## 2019-05-25 DIAGNOSIS — Z96651 Presence of right artificial knee joint: Secondary | ICD-10-CM | POA: Diagnosis not present

## 2019-05-25 DIAGNOSIS — I10 Essential (primary) hypertension: Secondary | ICD-10-CM | POA: Insufficient documentation

## 2019-05-25 DIAGNOSIS — E119 Type 2 diabetes mellitus without complications: Secondary | ICD-10-CM | POA: Insufficient documentation

## 2019-05-25 DIAGNOSIS — Y9389 Activity, other specified: Secondary | ICD-10-CM | POA: Diagnosis not present

## 2019-05-25 DIAGNOSIS — Y999 Unspecified external cause status: Secondary | ICD-10-CM | POA: Insufficient documentation

## 2019-05-25 DIAGNOSIS — Z7984 Long term (current) use of oral hypoglycemic drugs: Secondary | ICD-10-CM | POA: Insufficient documentation

## 2019-05-25 DIAGNOSIS — Z20828 Contact with and (suspected) exposure to other viral communicable diseases: Secondary | ICD-10-CM | POA: Insufficient documentation

## 2019-05-25 DIAGNOSIS — R1030 Lower abdominal pain, unspecified: Secondary | ICD-10-CM | POA: Diagnosis present

## 2019-05-25 DIAGNOSIS — K439 Ventral hernia without obstruction or gangrene: Secondary | ICD-10-CM | POA: Diagnosis not present

## 2019-05-25 DIAGNOSIS — Y9259 Other trade areas as the place of occurrence of the external cause: Secondary | ICD-10-CM | POA: Diagnosis not present

## 2019-05-25 DIAGNOSIS — S32592A Other specified fracture of left pubis, initial encounter for closed fracture: Secondary | ICD-10-CM | POA: Diagnosis not present

## 2019-05-25 MED ORDER — HYDROCODONE-ACETAMINOPHEN 5-325 MG PO TABS
ORAL_TABLET | ORAL | Status: AC
  Filled 2019-05-25: qty 1

## 2019-05-25 MED ORDER — HYDROCODONE-ACETAMINOPHEN 5-325 MG PO TABS: 1.0000 | ORAL_TABLET | Freq: Four times a day (QID) | ORAL | 0 refills | Status: DC | PRN

## 2019-05-25 MED ORDER — HYDROCODONE-ACETAMINOPHEN 5-325 MG PO TABS
1.0000 | ORAL_TABLET | Freq: Once | ORAL | Status: AC
  Administered 2019-05-25: 1 via ORAL

## 2019-05-25 NOTE — Discharge Instructions (Addendum)
You have been diagnosed today with fracture of the left superior and inferior pubic rami.  At this time there does not appear to be the presence of an emergent medical condition, however there is always the potential for conditions to change. Please read and follow the below instructions.  Please return to the Emergency Department immediately for any new or worsening symptoms. Please be sure to follow up with your Primary Care Provider within one week regarding your visit today; please call their office to schedule an appointment even if you are feeling better for a follow-up visit. Call the orthopedic specialist Dr. Stann Mainland tomorrow morning to schedule a follow-up appointment regarding your pelvis fracture. May use the pain medication Norco as prescribed for severe pain.  Do not drive or operate heavy machinery while taking Norco as this will cause drowsiness.  Do not drink alcohol or take other sedating medications with Norco as this will worsen side effects.  Norco will make you sleepy and may make it dangerous to perform any activities, use caution while taking this medication as to avoid further falls. 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 A home health consult has been placed for you, they should contact you within the next day or so to discuss assistance regarding your pelvic fracture.  Get help right away if you: Feel light-headed or faint. Develop chest pain. Develop shortness of breath. Have a fever. Have blood in your urine or your stools. Have bleeding in your vagina. Have difficulty or pain with urination or with passing stool. Have difficulty or increased pain with walking. Have new or increased swelling in one of your legs. Have numbness in your legs or groin area. Any new/concerning or worsening symptoms  Please read the additional information packets attached to your discharge summary.  Do not take  your medicine if  develop an itchy rash, swelling in your mouth or lips, or difficulty breathing; call 911 and seek immediate emergency medical attention if this occurs.

## 2019-05-25 NOTE — ED Provider Notes (Signed)
Epic Error, see other note.   Nicole [name] 05/28/19 Lurena Nida    Elnora Morrison, MD 05/31/19 [phone]

## 2019-05-25 NOTE — ED Provider Notes (Signed)
[facility] EMERGENCY DEPARTMENT Provider Note   CSN: [id number] Arrival date & time: 05/25/19  1757    History   Chief Complaint Chief Complaint  Patient presents with  . Fall    HPI Nicole [name] is a 73 y.o. female presented today following a fall that occurred yesterday morning.  Patient reports she was walking on the [employer] when she tripped over an uneven piece of pavement.  Patient reports she fell towards her left side onto her left hip.  She denies head injury, loss of consciousness or pain to the neck back chest or abdomen.  Patient reports she has had some left-sided groin pain since her fall that is worse with weightbearing and ambulation and improved with lying and sitting still.  No medications prior to arrival for her pain.  Denies head injury, loss of consciousness, neck pain, back pain, chest pain, abdominal pain, reports daily baby aspirin use no other blood thinners, denies pain to the extremities x4, denies wound/bruising, vision changes, nausea/vomiting or headache.    HPI  Past Medical History:  Diagnosis Date  . Diabetes mellitus    dx 5 - 6 yrs ago.  Marland Kitchen Hypertension   . Primary localized osteoarthritis of right knee 03/08/2018    Patient Active Problem List   Diagnosis Date Noted  . Hypercalcemia 09/13/2018  . Lower extremity edema 09/12/2018  . Collapsed lung 09/12/2018  . Spinal stenosis 08/14/2018  . Preoperative clearance 08/14/2018  . Primary localized osteoarthritis of right knee 03/08/2018  . Hypertension   . Diabetes mellitus, controlled ([place])   . Diabetes [facility])     Past Surgical History:  Procedure Laterality Date  . ABDOMINAL HYSTERECTOMY    . TOTAL KNEE ARTHROPLASTY Right 03/20/2018   Procedure: RIGHT TOTAL KNEE ARTHROPLASTY;  Surgeon: Elsie Saas, MD;  Location: [place];  Service: Orthopedics;  Laterality: Right;     OB History   No obstetric history on file.      Home Medications    Prior to Admission  medications   Medication Sig Start Date End Date Taking? Authorizing Provider  atenolol (TENORMIN) 25 MG tablet Take 25 mg by mouth daily.    [provider]  Biotin 5000 MCG TABS Take 1 tablet by mouth daily.    [provider]  hydrochlorothiazide (HYDRODIURIL) 25 MG tablet Take 25 mg by mouth daily.    [provider]  HYDROcodone-acetaminophen (NORCO/VICODIN) 5-325 MG tablet Take 1 tablet by mouth every 6 (six) hours as needed for severe pain. 05/25/19   Nuala Alpha A, PA-C  lisinopril (PRINIVIL,ZESTRIL) 10 MG tablet Take 1 tablet (10 mg total) by mouth daily. 09/13/18   Imogene Burn, PA-C  metFORMIN (GLUCOPHAGE-XR) 500 MG 24 hr tablet Take 500 mg by mouth daily. 08/01/18   [provider]  Multiple Vitamin (MULTI-VITAMINS) TABS Take 1 tablet by mouth daily.    [provider]  spironolactone (ALDACTONE) 25 MG tablet Take 1 tablet (25 mg total) by mouth daily. 08/14/18 08/14/19  Sueanne Margarita, MD    Family History Family History  Problem Relation Age of Onset  . Hypertension Mother   . Leukemia Father   . Obesity Sister     Social History Social History   Tobacco Use  . Smoking status: Never Smoker  . Smokeless tobacco: Never Used  Substance Use Topics  . Alcohol use: No  . Drug use: Never     Allergies   Patient has no known allergies.  Review of Systems Review of Systems  Constitutional: Negative.  Negative for chills and fever.  Eyes: Negative.  Negative for visual disturbance.  Respiratory: Negative.  Negative for shortness of breath.   Cardiovascular: Negative.  Negative for chest pain.  Gastrointestinal: Negative.  Negative for abdominal pain, diarrhea, nausea and vomiting.  Musculoskeletal: Positive for arthralgias (Left pelvic pain). Negative for back pain and neck pain.  Neurological: Negative.  Negative for dizziness, syncope, speech difficulty, weakness, numbness and headaches.  All other systems reviewed  and are negative.  Physical Exam Updated Vital Signs BP 130/66 (BP Location: Right Arm)   Pulse 68   Temp 99 F (37.2 C) (Oral)   Resp 20   Ht 5\' 3"  (1.6 m)   Wt 88 kg   SpO2 98%   BMI 34.37 kg/m   Physical Exam Constitutional:      General: She is not in acute distress.    Appearance: Normal appearance. She is well-developed. She is obese. She is not ill-appearing or diaphoretic.  HENT:     Head: Normocephalic and atraumatic. No raccoon eyes or Battle's sign.     Jaw: There is normal jaw occlusion. No trismus.     Right Ear: Tympanic membrane, ear canal and external ear normal. No hemotympanum.     Left Ear: Tympanic membrane, ear canal and external ear normal. No hemotympanum.     Nose: Nose normal. No rhinorrhea.     Right Nostril: No epistaxis.     Left Nostril: No epistaxis.     Mouth/Throat:     Mouth: Mucous membranes are moist.     Pharynx: Oropharynx is clear.  Eyes:     General: Vision grossly intact. Gaze aligned appropriately.     Extraocular Movements: Extraocular movements intact.     Conjunctiva/sclera: Conjunctivae normal.     Pupils: Pupils are equal, round, and reactive to light.  Neck:     Musculoskeletal: Full passive range of motion without pain, normal range of motion and neck supple.     Trachea: Trachea and phonation normal. No tracheal deviation.  Cardiovascular:     Rate and Rhythm: Normal rate and regular rhythm.     Pulses:          Dorsalis pedis pulses are 2+ on the right side and 2+ on the left side.  Pulmonary:     Effort: Pulmonary effort is normal. No respiratory distress.  Abdominal:     General: There is no distension.     Palpations: Abdomen is soft.     Tenderness: There is no abdominal tenderness. There is no guarding or rebound.  Musculoskeletal: Normal range of motion.     Left hip: She exhibits tenderness. She exhibits normal range of motion, no crepitus and no deformity.       Legs:     Comments: No midline C/T/L spinal  tenderness to palpation, no paraspinal muscle tenderness, no deformity, crepitus, or step-off noted. No sign of injury to the neck or back.  Skin:    General: Skin is warm and dry.  Neurological:     Mental Status: She is alert.     GCS: GCS eye subscore is 4. GCS verbal subscore is 5. GCS motor subscore is 6.     Comments: Speech is clear and goal oriented, follows commands Major Cranial nerves without deficit, no facial droop Moves extremities without ataxia, coordination intact  Psychiatric:        Behavior: Behavior normal.    ED Treatments /  Results  Labs (all labs ordered are listed, but only abnormal results are displayed) Labs Reviewed - No data to display  EKG None  Radiology Ct Pelvis Wo Contrast  Result Date: 05/25/2019 CLINICAL DATA:  Pelvic pain after fall yesterday. EXAM: CT PELVIS WITHOUT CONTRAST TECHNIQUE: Multidetector CT imaging of the pelvis was performed following the standard protocol without intravenous contrast. COMPARISON:  None. FINDINGS: Urinary Tract:  No abnormality visualized. Bowel:  Unremarkable visualized pelvic bowel loops. Vascular/Lymphatic: No pathologically enlarged lymph nodes. No significant vascular abnormality seen. Reproductive: Status post hysterectomy. No adnexal abnormality is noted. Other: Large fat containing ventral hernia is noted in left lower quadrant. Musculoskeletal: Hip joints are unremarkable. Mildly displaced and comminuted fractures are seen involving the left superior and inferior pubic rami. Small hematoma is seen in the surrounding soft tissues. IMPRESSION: Mildly displaced and comminuted left superior and inferior pubic rami fractures. Small surrounding soft tissue hematoma is noted. Large fat containing ventral hernia is noted in left lower quadrant. Electronically Signed   By: Marijo Conception M.D.   On: 05/25/2019 21:30   Dg Hip Unilat With Pelvis 2-3 Views Left  Result Date: 05/25/2019 CLINICAL DATA:  Pain after fall  yesterday. EXAM: DG HIP (WITH OR WITHOUT PELVIS) 2-3V LEFT COMPARISON:  None. FINDINGS: Mild deformity of the left superior pubic ramus is identified, most consistent with a fracture. No other abnormalities. IMPRESSION: Subtle deformity of the left superior pubic ramus is suspicious for a fracture given history. No fracture through the proximal femur. Electronically Signed   By: Dorise Bullion III M.D   On: 05/25/2019 19:02   Procedures Procedures (including critical care time)  Medications Ordered in ED Medications  HYDROcodone-acetaminophen (NORCO/VICODIN) 5-325 MG per tablet (has no administration in time range)  HYDROcodone-acetaminophen (NORCO/VICODIN) 5-325 MG per tablet 1 tablet (has no administration in time range)     Initial Impression / Assessment and Plan / ED Course  I have reviewed the triage vital signs and the nursing notes.  Pertinent labs & imaging results that were available during my care of the patient were reviewed by me and considered in my medical decision making (see chart for details).  Clinical Course as of May 24 2248  Fri May 25, 2019  2248 Dr. Ninfa Linden   [BM]    Clinical Course User Index [BM] Nuala Alpha A, PA-C   Mechanical fall yesterday without loss of consciousness, head injury, neck pain, back pain, chest pain or abdominal pain.  No blood thinning medications, no visual changes, headache, nausea/vomiting.  No indication for imaging of the head at this time.  She has some left hip tenderness on exam she has been having pain however with ambulation she can bear weight but with increased pain she has been moving around the past day using her mother's old wheelchair.  DG pelvis:  IMPRESSION: Subtle deformity of the left superior pubic ramus is suspicious for a fracture given history. No fracture through the proximal femur. - We will order CT pelvis without contrast to evaluate for further fracture and then consult orthopedics for follow-up.   Discussed case with Dr. Reather Converse. - CT Pelvis:  IMPRESSION: Mildly displaced and comminuted left superior and inferior pubic rami fractures. Small surrounding soft tissue hematoma is noted. Large fat containing ventral hernia is noted in left lower quadrant.  - Patient has been offered admission for observation and orthopedic consult regarding her fracture and she has declined.  Patient seen and evaluated by Dr. Reather Converse, plan of care  at this time is to discharge patient with orthopedic follow-up and give home health orders additionally few Norco for pain control. - Discussed case with orthopedic surgeon Dr. Ninfa Linden, agrees with plan of care, outpatient follow-up. - Patient was prescribed 6 pills of Norco for her acute fracture, discussed precautions regarding narcotics with the patient and she states understanding.  Patient has family members at home were able to help take care of her, daughter is a Marine scientist.  Patient reports that agreeable to watch her and help with ADLs.  Patient states understanding to call orthopedic specialist tomorrow to schedule follow-up regarding her pubic rami fractures.   At this time there does not appear to be any evidence of an acute emergency medical condition and the patient appears stable for discharge with appropriate outpatient follow up. Diagnosis was discussed with patient who verbalizes understanding of care plan and is agreeable to discharge. I have discussed return precautions with patient who verbalizes understanding of return precautions. Patient encouraged to follow-up with their PCP and ortho. All questions answered.  Patient's case discussed with Dr. Reather Converse who agrees with plan to discharge with Norco and outpatient follow-up.   Note: Portions of this report may have been transcribed using voice recognition software. Every effort was made to ensure accuracy; however, inadvertent computerized transcription errors may still be present.  Final Clinical  Impressions(s) / ED Diagnoses   Final diagnoses:  Closed fracture of multiple pubic rami, left, initial encounter ([place])  Ventral hernia without obstruction or gangrene    ED Discharge Orders         [place]     05/25/19 2217    Face-to-face encounter (required for Medicare/Medicaid patients)    Comments: [facility] certify that this patient is under my care and that I, or a nurse practitioner or physician's assistant working with me, had a face-to-face encounter that meets the physician face-to-face encounter requirements with this patient on 05/25/2019. The encounter with the patient was in whole, or in part for the following medical condition(s) which is the primary reason for home health care (List medical condition): Pubic rami fracture   05/25/19 2217    HYDROcodone-acetaminophen (NORCO/VICODIN) 5-325 MG tablet  Every 6 hours PRN     05/25/19 2243           Gari Crown 05/25/19 2251    Elnora Morrison, MD 05/31/19 1744

## 2019-05-25 NOTE — ED Triage Notes (Signed)
Pt states she fell onto left hip yesterday-pain to left groin area-to triage-NAD

## 2019-08-10 DIAGNOSIS — E041 Nontoxic single thyroid nodule: Secondary | ICD-10-CM | POA: Insufficient documentation

## 2019-08-10 DIAGNOSIS — M810 Age-related osteoporosis without current pathological fracture: Secondary | ICD-10-CM | POA: Insufficient documentation

## 2019-08-16 ENCOUNTER — Other Ambulatory Visit: Payer: Self-pay | Admitting: Internal Medicine

## 2019-08-16 DIAGNOSIS — M810 Age-related osteoporosis without current pathological fracture: Secondary | ICD-10-CM

## 2019-08-16 DIAGNOSIS — Z1231 Encounter for screening mammogram for malignant neoplasm of breast: Secondary | ICD-10-CM

## 2019-10-11 DIAGNOSIS — E210 Primary hyperparathyroidism: Secondary | ICD-10-CM | POA: Insufficient documentation

## 2019-11-01 ENCOUNTER — Ambulatory Visit
Admission: RE | Admit: 2019-11-01 | Discharge: 2019-11-01 | Disposition: A | Discharge status: Home / Self Care | Payer: Medicare Other | Source: Ambulatory Visit | Attending: Internal Medicine | Admitting: Internal Medicine

## 2019-11-01 ENCOUNTER — Other Ambulatory Visit: Payer: Self-pay

## 2019-11-01 DIAGNOSIS — Z1231 Encounter for screening mammogram for malignant neoplasm of breast: Secondary | ICD-10-CM

## 2019-11-01 DIAGNOSIS — M810 Age-related osteoporosis without current pathological fracture: Secondary | ICD-10-CM

## 2019-12-17 ENCOUNTER — Ambulatory Visit: Payer: Medicare Other | Attending: Internal Medicine

## 2019-12-17 DIAGNOSIS — Z23 Encounter for immunization: Secondary | ICD-10-CM | POA: Insufficient documentation

## 2019-12-17 NOTE — Progress Notes (Signed)
   Covid-19 Vaccination Clinic  Name:  Nicole [name]    MRN: [id number] DOB: [birth year]  12/17/2019  Nicole [name] was observed post Covid-19 immunization for 15 minutes without incidence. She was provided with Vaccine Information Sheet and instruction to access the V-Safe system.   Nicole [name] was instructed to call 911 with any severe reactions post vaccine: Marland Kitchen Difficulty breathing  . Swelling of your face and throat  . A fast heartbeat  . A bad rash all over your body  . Dizziness and weakness    Immunizations Administered    Name Date Dose VIS Date Route   Pfizer COVID-19 Vaccine 12/17/2019  9:07 AM 0.3 mL 11/02/2019 Intramuscular   Manufacturer: [place]   Lot: [id number]   [place]: [id number]

## 2020-01-07 ENCOUNTER — Ambulatory Visit: Payer: Medicare Other | Attending: Internal Medicine

## 2020-01-07 DIAGNOSIS — Z23 Encounter for immunization: Secondary | ICD-10-CM | POA: Insufficient documentation

## 2020-01-07 NOTE — Progress Notes (Signed)
   Covid-19 Vaccination Clinic  Name:  Nicole [name]    MRN: [id number] DOB: 1946-06-17  01/07/2020  Nicole [name] was observed post Covid-19 immunization for 15 minutes without incidence. She was provided with Vaccine Information Sheet and instruction to access the V-Safe system.   Nicole [name] was instructed to call 911 with any severe reactions post vaccine: Marland Kitchen Difficulty breathing  . Swelling of your face and throat  . A fast heartbeat  . A bad rash all over your body  . Dizziness and weakness    Immunizations Administered    Name Date Dose VIS Date Route   Pfizer COVID-19 Vaccine 01/07/2020 10:02 AM 0.3 mL 11/02/2019 Intramuscular   Manufacturer: [place]   Lot: [id number]   [place]: [id number]

## 2020-01-24 ENCOUNTER — Ambulatory Visit (INDEPENDENT_AMBULATORY_CARE_PROVIDER_SITE_OTHER): Payer: Medicare Other | Admitting: Podiatry

## 2020-01-24 ENCOUNTER — Other Ambulatory Visit: Payer: Self-pay

## 2020-01-24 DIAGNOSIS — L84 Corns and callosities: Secondary | ICD-10-CM | POA: Diagnosis not present

## 2020-01-24 DIAGNOSIS — B351 Tinea unguium: Secondary | ICD-10-CM

## 2020-01-24 DIAGNOSIS — E1151 Type 2 diabetes mellitus with diabetic peripheral angiopathy without gangrene: Secondary | ICD-10-CM

## 2020-02-15 ENCOUNTER — Other Ambulatory Visit: Payer: Self-pay | Admitting: Surgery

## 2020-02-15 DIAGNOSIS — E041 Nontoxic single thyroid nodule: Secondary | ICD-10-CM

## 2020-02-21 ENCOUNTER — Other Ambulatory Visit: Payer: Medicare Other

## 2020-02-26 ENCOUNTER — Ambulatory Visit
Admission: RE | Admit: 2020-02-26 | Discharge: 2020-02-26 | Disposition: A | Discharge status: Home / Self Care | Payer: Medicare Other | Source: Ambulatory Visit | Attending: Surgery | Admitting: Surgery

## 2020-02-26 ENCOUNTER — Other Ambulatory Visit (HOSPITAL_COMMUNITY)
Admission: RE | Admit: 2020-02-26 | Discharge: 2020-02-26 | Disposition: A | Discharge status: Home / Self Care | Payer: Medicare Other | Source: Ambulatory Visit | Attending: Radiology | Admitting: Radiology

## 2020-02-26 DIAGNOSIS — D440 Neoplasm of uncertain behavior of thyroid gland: Secondary | ICD-10-CM | POA: Diagnosis not present

## 2020-02-26 DIAGNOSIS — E041 Nontoxic single thyroid nodule: Secondary | ICD-10-CM | POA: Diagnosis present

## 2020-02-28 LAB — CYTOLOGY - NON PAP

## 2020-03-13 ENCOUNTER — Encounter (HOSPITAL_COMMUNITY): Payer: Self-pay | Admitting: Radiology

## 2020-03-13 ENCOUNTER — Other Ambulatory Visit (HOSPITAL_COMMUNITY): Payer: Self-pay | Admitting: Surgery

## 2020-03-13 DIAGNOSIS — E041 Nontoxic single thyroid nodule: Secondary | ICD-10-CM

## 2020-03-13 NOTE — Progress Notes (Signed)
Lovelyn A. Bier Female, 74 y.o., [birth year] MRN:  [id number] Phone:  [phone] (H) PCP:  Aletha Halim., PA-C Primary Cvg:  Medicare/Medicare Part A And B Next Appt With Podiatry 04/25/2020 at 8:45 AM  RE: US Thyroid Biopsy Received: Today Message Contents  Sandi Mariscal, MD  Garth Bigness D  Repeat US guided Bx of previously Bx'd nodule.   Spoke with Gerkin, he wants [place] to give it one more chance as it will affect management.   Please set up at either [facility] or [facility] with cytology.   Please collect Affirma as well.   Cathren Harsh       Previous Messages   ----- Message -----  From: Garth Bigness D  Sent: 03/13/2020  2:05 PM EDT  To: Sandi Mariscal, MD  Subject: US Thyroid Biopsy                 Procedure: US Thyroid Biopsy 1st Lesion   Reason: 2.0 cm nodule right inferior lobe   History: no prior imaging in system, prior Thyroid Biopsy done @ [facility] on 08/2019, 2nd Biopsy done at GI on 02/26/20, Provider asking for 3rd attempt at [facility]   Provider:  Harlow Asa, TODD   Provider Contact: [phone]

## 2020-03-13 NOTE — Progress Notes (Signed)
Adelei A. Pugh Female, 74 y.o., [birth year] MRN:  [id number] Phone:  [phone] (H) PCP:  Aletha Halim., PA-C Primary Cvg:  Medicare/Medicare Part A And B Next Appt With Podiatry 04/25/2020 at 8:45 AM  FW: Thyroid BX Received: Today Message Contents  Donn Pierini D      Previous Messages   ----- Message -----  From: Randa Spike  Sent: 03/13/2020 10:31 AM EDT  To: Lenore Cordia  Subject: RE: Thyroid BX                  The order is on the way Dr. Pascal Lux did write on the order that a cytology tech will need to evaluate specimen before the patient leaves appt.   Thank you  ----- Message -----  From: Lenore Cordia  Sent: 03/13/2020 10:21 AM EDT  To: [place] Coble  Subject: RE: Thyroid BX                  Good Morning   [phone] or [phone]....  ----- Message -----  From: Randa Spike  Sent: 03/13/2020  9:59 AM EDT  To: Marcelyn Bruins, Lenore Cordia  Subject: Thyroid BX                    Good Morning,   Dr. Pascal Lux just spoke w/ Dr. Harlow Asa about an order that was sent over to our office for a thyroid biopsy; Dr. Pascal Lux has recommended that the patient be scheduled over at either [facility] or [facility]; is there a fax number I can send the order too?   Thank you,  [place] C.

## 2020-03-19 ENCOUNTER — Ambulatory Visit (HOSPITAL_COMMUNITY)
Admission: RE | Admit: 2020-03-19 | Discharge: 2020-03-19 | Disposition: A | Discharge status: Home / Self Care | Payer: Medicare Other | Source: Ambulatory Visit | Attending: Surgery | Admitting: Surgery

## 2020-03-19 ENCOUNTER — Other Ambulatory Visit: Payer: Self-pay

## 2020-03-19 DIAGNOSIS — E041 Nontoxic single thyroid nodule: Secondary | ICD-10-CM | POA: Diagnosis present

## 2020-03-19 DIAGNOSIS — D440 Neoplasm of uncertain behavior of thyroid gland: Secondary | ICD-10-CM | POA: Insufficient documentation

## 2020-03-19 MED ORDER — LIDOCAINE HCL (PF) 1 % IJ SOLN
INTRAMUSCULAR | Status: AC
  Filled 2020-03-19: qty 30

## 2020-03-20 LAB — CYTOLOGY - NON PAP

## 2020-04-10 ENCOUNTER — Ambulatory Visit: Payer: Self-pay | Admitting: Surgery

## 2020-04-25 ENCOUNTER — Ambulatory Visit (INDEPENDENT_AMBULATORY_CARE_PROVIDER_SITE_OTHER): Payer: Medicare Other | Admitting: Podiatry

## 2020-04-25 ENCOUNTER — Other Ambulatory Visit: Payer: Self-pay

## 2020-04-25 DIAGNOSIS — L84 Corns and callosities: Secondary | ICD-10-CM

## 2020-04-25 DIAGNOSIS — E1151 Type 2 diabetes mellitus with diabetic peripheral angiopathy without gangrene: Secondary | ICD-10-CM

## 2020-04-25 NOTE — Progress Notes (Signed)
  Subjective:  Patient ID: SEVIN FARONE, female    DOB: 1946-08-20,  MRN: [id number]  Chief Complaint  Patient presents with  . Foot Pain    pt is here for a callus trim, pt is a type 2 diabetic, pt also has PAD Peripheral Artery Disease.    74 y.o. female presents with the above complaint. History confirmed with patient.   Objective:  Physical Exam: warm, good capillary refill, nail exam normal nails without lesions, no trophic changes or ulcerative lesions. DP pulses palpable and protective sensation intact. PT non-palp  Right Foot: HPK 5th met base   No images are attached to the encounter.  Assessment:   1. Diabetes mellitus type 2 with peripheral artery disease (HCC)   2. Callus     Plan:  Patient was evaluated and treated and all questions answered.  Diabetes, HPK -Lesion pared  Procedure: Paring of Lesion Rationale: painful hyperkeratotic lesion Type of Debridement: manual, sharp debridement. Instrumentation: 312 blade Number of Lesions: 1   Return in about 3 months (around 07/26/2020) for Diabetic Foot Care.

## 2020-04-25 NOTE — Progress Notes (Signed)
  Subjective:  Patient ID: Nicole [name], female    DOB: 1946/03/08,  MRN: [id number]  Chief Complaint  Patient presents with  . Callouses    Right foot lateral aspect painful callous, years duration, pt only wants a trim and is not interested in surgery.    74 y.o. female presents with the above complaint. History confirmed with patient.   Objective:  Physical Exam: warm, good capillary refill, nail exam normal nails without lesions, no trophic changes or ulcerative lesions. DP pulses palpable and protective sensation intact. PT non-palp  Right Foot: HPK 5th met   No images are attached to the encounter.  Assessment:   1. Diabetes mellitus type 2 with peripheral artery disease (HCC)   2. Callus      Plan:  Patient was evaluated and treated and all questions answered.  Diabetes, HPK -Lesion pared  Procedure: Paring of Lesion Rationale: painful hyperkeratotic lesion Type of Debridement: manual, sharp debridement. Instrumentation: 312 blade Number of Lesions: 1  Return in about 3 months (around 04/25/2020) for Diabetic Foot Care.

## 2020-05-27 NOTE — Patient Instructions (Addendum)
DUE TO COVID-19 ONLY ONE VISITOR IS ALLOWED TO COME WITH YOU AND STAY IN THE WAITING ROOM ONLY DURING PRE OP AND PROCEDURE DAY OF SURGERY. THE 1 VISITOR MAY VISIT WITH YOU AFTER SURGERY IN YOUR PRIVATE ROOM DURING VISITING HOURS ONLY!  YOU NEED TO HAVE A COVID 19 TEST ON: 06/03/20 @ 9:30 am , THIS TEST MUST BE DONE BEFORE SURGERY, COME  [place], [place] , [address].  ([place]) ONCE YOUR COVID TEST IS COMPLETED, PLEASE BEGIN THE QUARANTINE INSTRUCTIONS AS OUTLINED IN YOUR HANDOUT.                Carlena A Sassi    Your procedure is scheduled on: 06/06/20   Report to [facility] Main  Entrance   Report to short stay at: 5:30 AM     Call this number if you have problems the morning of surgery [phone]    Remember: Do not eat food or drink liquids :After Midnight.   BRUSH YOUR TEETH MORNING OF SURGERY AND RINSE YOUR MOUTH OUT, NO CHEWING GUM CANDY OR MINT  How to Manage Your Diabetes Before and After Surgery  Why is it important to control my blood sugar before and after surgery? . Improving blood sugar levels before and after surgery helps healing and can limit problems. . A way of improving blood sugar control is eating a healthy diet by: o  Eating less sugar and carbohydrates o  Increasing activity/exercise o  Talking with your doctor about reaching your blood sugar goals . High blood sugars (greater than 180 mg/dL) can raise your risk of infections and slow your recovery, so you will need to focus on controlling your diabetes during the weeks before surgery. . Make sure that the doctor who takes care of your diabetes knows about your planned surgery including the date and location.  How do I manage my blood sugar before surgery? . Check your blood sugar at least 4 times a day, starting 2 days before surgery, to make sure that the level is not too high or low. o Check your blood sugar the morning of your surgery when you wake up and every  2 hours until you get to the Short Stay unit. . If your blood sugar is less than 70 mg/dL, you will need to treat for low blood sugar: o Do not take insulin. o Treat a low blood sugar (less than 70 mg/dL) with  cup of clear juice (cranberry or apple), 4 glucose tablets, OR glucose gel. o Recheck blood sugar in 15 minutes after treatment (to make sure it is greater than 70 mg/dL). If your blood sugar is not greater than 70 mg/dL on recheck, call [phone] for further instructions. . Report your blood sugar to the short stay nurse when you get to Short Stay.  . If you are admitted to the hospital after surgery: o Your blood sugar will be checked by the staff and you will probably be given insulin after surgery (instead of oral diabetes medicines) to make sure you have good blood sugar levels. o The goal for blood sugar control after surgery is 80-180 mg/dL.   WHAT DO I DO ABOUT MY DIABETES MEDICATION?  Marland Kitchen Do not take oral diabetes medicines (pills) the morning of surgery.  . THE DAY BEFORE SURGERY, take Metformin as usual.      . THE MORNING OF SURGERY, DO NOT take Metformin.   DO NOT TAKE ANY DIABETIC MEDICATIONS DAY OF YOUR  SURGERY                               You may not have any metal on your body including hair pins and              piercings  Do not wear jewelry, make-up, lotions, powders or perfumes, deodorant             Do not wear nail polish on your fingernails.  Do not shave  48 hours prior to surgery.               Do not bring valuables to the hospital. [place].  Contacts, dentures or bridgework may not be worn into surgery.  Leave suitcase in the car. After surgery it may be brought to your room.     Patients discharged the day of surgery will not be allowed to drive home. IF YOU ARE HAVING SURGERY AND GOING HOME THE SAME DAY, YOU MUST HAVE AN ADULT TO DRIVE YOU HOME AND BE WITH YOU FOR 24 HOURS. YOU MAY GO HOME BY  TAXI OR UBER OR ORTHERWISE, BUT AN ADULT MUST ACCOMPANY YOU HOME AND STAY WITH YOU FOR 24 HOURS.  Name and phone number of your driver:  Special Instructions: N/A              Please read over the following fact sheets you were given: _____________________________________________________________________  [facility] - Preparing for Surgery Before surgery, you can play an important role.  Because skin is not sterile, your skin needs to be as free of germs as possible.  You can reduce the number of germs on your skin by washing with CHG (chlorahexidine gluconate) soap before surgery.  CHG is an antiseptic cleaner which kills germs and bonds with the skin to continue killing germs even after washing. Please DO NOT use if you have an allergy to CHG or antibacterial soaps.  If your skin becomes reddened/irritated stop using the CHG and inform your nurse when you arrive at Short Stay. Do not shave (including legs and underarms) for at least 48 hours prior to the first CHG shower.  You may shave your face/neck. Please follow these instructions carefully:  1.  Shower with CHG Soap the night before surgery and the  morning of Surgery.  2.  If you choose to wash your hair, wash your hair first as usual with your  normal  shampoo.  3.  After you shampoo, rinse your hair and body thoroughly to remove the  shampoo.                           4.  Use CHG as you would any other liquid soap.  You can apply chg directly  to the skin and wash                       Gently with a scrungie or clean washcloth.  5.  Apply the CHG Soap to your body ONLY FROM THE NECK DOWN.   Do not use on face/ open                           Wound or open sores. Avoid contact with eyes, ears  mouth and genitals (private parts).                       Wash face,  Genitals (private parts) with your normal soap.             6.  Wash thoroughly, paying special attention to the area where your surgery  will be performed.  7.  Thoroughly rinse  your body with warm water from the neck down.  8.  DO NOT shower/wash with your normal soap after using and rinsing off  the CHG Soap.                9.  Pat yourself dry with a clean towel.            10.  Wear clean pajamas.            11.  Place clean sheets on your bed the night of your first shower and do not  sleep with pets. Day of Surgery : Do not apply any lotions/deodorants the morning of surgery.  Please wear clean clothes to the hospital/surgery center.  FAILURE TO FOLLOW THESE INSTRUCTIONS MAY RESULT IN THE CANCELLATION OF YOUR SURGERY PATIENT SIGNATURE_________________________________  NURSE SIGNATURE__________________________________  ________________________________________________________________________

## 2020-05-28 ENCOUNTER — Encounter (HOSPITAL_COMMUNITY)
Admission: RE | Admit: 2020-05-28 | Discharge: 2020-05-28 | Disposition: A | Discharge status: Home / Self Care | Payer: Medicare Other | Source: Ambulatory Visit | Attending: Surgery | Admitting: Surgery

## 2020-05-28 ENCOUNTER — Other Ambulatory Visit: Payer: Self-pay

## 2020-05-28 ENCOUNTER — Encounter (HOSPITAL_COMMUNITY): Payer: Self-pay

## 2020-05-28 ENCOUNTER — Ambulatory Visit (HOSPITAL_COMMUNITY)
Admission: RE | Admit: 2020-05-28 | Discharge: 2020-05-28 | Disposition: A | Discharge status: Home / Self Care | Payer: Medicare Other | Source: Ambulatory Visit | Attending: Anesthesiology | Admitting: Anesthesiology

## 2020-05-28 DIAGNOSIS — E041 Nontoxic single thyroid nodule: Secondary | ICD-10-CM

## 2020-05-28 DIAGNOSIS — Z79899 Other long term (current) drug therapy: Secondary | ICD-10-CM | POA: Insufficient documentation

## 2020-05-28 DIAGNOSIS — I10 Essential (primary) hypertension: Secondary | ICD-10-CM | POA: Diagnosis not present

## 2020-05-28 DIAGNOSIS — E119 Type 2 diabetes mellitus without complications: Secondary | ICD-10-CM | POA: Insufficient documentation

## 2020-05-28 DIAGNOSIS — E210 Primary hyperparathyroidism: Secondary | ICD-10-CM | POA: Diagnosis not present

## 2020-05-28 DIAGNOSIS — Z01818 Encounter for other preprocedural examination: Secondary | ICD-10-CM | POA: Insufficient documentation

## 2020-05-28 LAB — BASIC METABOLIC PANEL
Anion gap: 8 (ref 5–15)
BUN: 16 mg/dL (ref 8–23)
CO2: 29 mmol/L (ref 22–32)
Calcium: 11.9 mg/dL — ABNORMAL HIGH (ref 8.9–10.3)
Chloride: 105 mmol/L (ref 98–111)
Creatinine, Ser: 0.82 mg/dL (ref 0.44–1.00)
GFR calc Af Amer: >60 mL/min (ref >60)
GFR calc non Af Amer: >60 mL/min (ref >60)
Glucose, Bld: 131 mg/dL — ABNORMAL HIGH (ref 70–99)
Potassium: 4.2 mmol/L (ref 3.5–5.1)
Sodium: 142 mmol/L (ref 135–145)

## 2020-05-28 LAB — CBC
HCT: 42.1 % (ref 36.0–46.0)
Hemoglobin: 13 g/dL (ref 12.0–15.0)
MCH: 30.1 pg (ref 26.0–34.0)
MCHC: 30.9 g/dL (ref 30.0–36.0)
MCV: 97.5 fL (ref 80.0–100.0)
Platelets: 235 10*3/uL (ref 150–400)
RBC: 4.32 MIL/uL (ref 3.87–5.11)
RDW: 13.5 % (ref 11.5–15.5)
WBC: 8.1 10*3/uL (ref 4.0–10.5)
nRBC: 0 % (ref 0.0–0.2)

## 2020-05-28 LAB — HEMOGLOBIN A1C
Hgb A1c MFr Bld: 6.6 % — ABNORMAL HIGH (ref 4.8–5.6)
Mean Plasma Glucose: 142.72 mg/dL

## 2020-05-28 LAB — GLUCOSE, CAPILLARY: Glucose-Capillary: 134 mg/dL — ABNORMAL HIGH (ref 70–99)

## 2020-05-28 NOTE — Progress Notes (Signed)
COVID Vaccine Completed:yes Date COVID Vaccine completed:01/07/20 COVID vaccine manufacturer: *[employer]   PCP - Bing Matter: PAC. LOV: 05/21/20 Cardiologist - Dr. Fransico Him. LOV: two years ago.  Chest x-ray -  EKG -  Stress Test -  ECHO - 2019 Cardiac Cath -   Sleep Study -  CPAP -   Fasting Blood Sugar - 120's Checks Blood Sugar  1    times a day  Blood Thinner Instructions: Aspirin Instructions:No instructions.RN advised pt. To call MD. For instructions. Last Dose:  Anesthesia review: Hx: DIA,HTN  Patient denies shortness of breath, fever, cough and chest pain at PAT appointment   Patient verbalized understanding of instructions that were given to them at the PAT appointment. Patient was also instructed that they will need to review over the PAT instructions again at home before surgery.

## 2020-06-02 ENCOUNTER — Encounter (HOSPITAL_COMMUNITY): Payer: Self-pay | Admitting: Surgery

## 2020-06-02 DIAGNOSIS — E042 Nontoxic multinodular goiter: Secondary | ICD-10-CM | POA: Diagnosis present

## 2020-06-02 NOTE — H&P (Signed)
General Surgery [facility]  Nicole [name] DOB: 1946-06-25 Married / Language: English / Race: White Female   History of Present Illness  The patient is a 74 year old female who presents with primary hyperparathyroidism.  CHIEF COMPLAINT: primary hyperparathyroidism, multiple thyroid nodules  Patient returns to discuss results of attempted fine-needle aspiration biopsies. Patient has now undergone 3 separate attempts at fine-needle aspiration biopsy of the nodule in the right inferior thyroid lobe measuring 2 cm in size. All 3 attempts have rendered scant follicular epithelium, insufficient quantity for evaluation. Patient does have a positive nuclear medicine parathyroid scan which localizes a right-sided parathyroid adenoma. Today we discussed proceeding with surgery to perform parathyroidectomy and 2 perform right thyroid lobectomy for evaluation and definitive diagnosis of the inferior right thyroid nodule. We reviewed her cytopathology report.   Problem List/Past Medical  MULTIPLE THYROID NODULES (E04.2)  PRIMARY HYPERPARATHYROIDISM (E21.0)   Past Surgical History  Hysterectomy (not due to cancer) - Complete  Knee Surgery  Right.  Diagnostic Studies History  Colonoscopy  5-10 years ago Mammogram  within last year Pap Smear  >5 years ago  Allergies  No Known Allergies  Medication History  Atenolol (25MG  Tablet, Oral) Active. Furosemide (20MG  Tablet, Oral) Active. Lisinopril (10MG  Tablet, Oral) Active. metFORMIN HCl ER (500MG  Tablet ER 24HR, Oral) Active. Spironolactone (25MG  Tablet, Oral) Active. Biotin (5000MCG Tablet, Oral) Active. Multivitamin (Oral) Active. Medications Reconciled  Social History  Caffeine use  Carbonated beverages, Coffee, Tea. No alcohol use  Tobacco use  Never smoker.  Pregnancy / Birth History Contraceptive History  Contraceptive implant, Oral contraceptives. Gravida  1 Maternal age   79-25 Para  1  Other Problems  Arthritis  Back Pain  Cholelithiasis  Diabetes Mellitus  Gastroesophageal Reflux Disease  High blood pressure  Thyroid Disease   Vitals  Weight: 195.38 lb Height: 62in Body Surface Area: 1.89 m Body Mass Index: 35.73 kg/m  Temp.: 94.108F  Pulse: 79 (Regular)   Physical Exam   GENERAL APPEARANCE Development: normal Nutritional status: normal Gross deformities: none  SKIN Rash, lesions, ulcers: none Induration, erythema: none Nodules: none palpable  EYES Conjunctiva and lids: normal Pupils: equal and reactive Iris: normal bilaterally  EARS, NOSE, MOUTH, THROAT External ears: no lesion or deformity External nose: no lesion or deformity Hearing: grossly normal Due to Covid-19 pandemic, patient is wearing a mask.  NECK Symmetric: yes Trachea: midline Thyroid: No palpable nodule in the left thyroid lobe. In the right thyroid lobe, with swallowing maneuver, is a subtle inferior nodule. This is not particularly firm and it is nontender. There isa seated lymphadenopathy.  CHEST Respiratory effort: normal Retraction or accessory muscle use: no Breath sounds: normal bilaterally Rales, rhonchi, wheeze: none  CARDIOVASCULAR Auscultation: regular rhythm, normal rate Murmurs: none Pulses: radial pulse 2+ palpable Lower extremity edema: none  MUSCULOSKELETAL Station and gait: normal Digits and nails: no clubbing or cyanosis Muscle strength: grossly normal all extremities Range of motion: grossly normal all extremities Deformity: none  LYMPHATIC Cervical: none palpable Supraclavicular: none palpable  PSYCHIATRIC Oriented to person, place, and time: yes Mood and affect: normal for situation Judgment and insight: appropriate for situation    Assessment & Plan  MULTIPLE THYROID NODULES (E04.2) PRIMARY HYPERPARATHYROIDISM (E21.0)  Patient returns today to discuss the results of attempted fine-needle  aspiration biopsy. Patient has undergone 3 separate biopsy procedures, all of which have returned insufficient tissue for cytology evaluation.  Patient does have primary hyperparathyroidism and has localized a right sided  parathyroid adenoma. The thyroid nodule in question is in the inferior right thyroid lobe. I have recommended proceeding with parathyroidectomy and concurrent right thyroid lobectomy. We have discussed the procedure. We have discussed the risk of recurrent laryngeal nerve injury. We have discussed the size of the surgical incision. We have discussed an overnight hospital stay. We have discussed the possible need for thyroid hormone supplementation. We have discussed the possible need for additional thyroid surgery based on the pathology results. The patient understands and wishes to proceed with surgery in the near future.  The risks and benefits of the procedure have been discussed at length with the patient. The patient understands the proposed procedure, potential alternative treatments, and the course of recovery to be expected. All of the patient's questions have been answered at this time. The patient wishes to proceed with surgery.  Armandina Gemma, MD [facility] Office: [phone]

## 2020-06-03 ENCOUNTER — Other Ambulatory Visit (HOSPITAL_COMMUNITY)
Admission: RE | Admit: 2020-06-03 | Discharge: 2020-06-03 | Disposition: A | Discharge status: Home / Self Care | Payer: Medicare Other | Source: Ambulatory Visit | Attending: Surgery | Admitting: Surgery

## 2020-06-03 DIAGNOSIS — Z20822 Contact with and (suspected) exposure to covid-19: Secondary | ICD-10-CM | POA: Insufficient documentation

## 2020-06-03 LAB — SARS CORONAVIRUS 2 (TAT 6-24 HRS): SARS Coronavirus 2: NEGATIVE

## 2020-06-05 NOTE — Anesthesia Preprocedure Evaluation (Addendum)
Anesthesia Evaluation  Patient identified by MRN, date of birth, ID band Patient awake    Reviewed: Allergy & Precautions, NPO status , Patient's Chart, lab work & pertinent test results, reviewed documented beta blocker date and time   Airway Mallampati: III  TM Distance: >3 FB Neck ROM: Full    Dental  (+) Chipped,    Pulmonary neg pulmonary ROS,    Pulmonary exam normal breath sounds clear to auscultation       Cardiovascular hypertension, Pt. on home beta blockers and Pt. on medications Normal cardiovascular exam Rhythm:Regular Rate:Normal  ECG: SB, rate 58   Neuro/Psych negative neurological ROS  negative psych ROS   GI/Hepatic negative GI ROS, Neg liver ROS,   Endo/Other  diabetes, Oral Hypoglycemic Agents  Renal/GU negative Renal ROS     Musculoskeletal  (+) Arthritis ,   Abdominal (+) + obese,   Peds  Hematology negative hematology ROS (+)   Anesthesia Other Findings PRIMARY HYPERPARATHYROIDISM, RIGHT THYROID NODULE  Reproductive/Obstetrics                           Anesthesia Physical Anesthesia Plan  ASA: III  Anesthesia Plan: General   Post-op Pain Management:    Induction: Intravenous  PONV Risk Score and Plan: 4 or greater and Ondansetron, Dexamethasone, Propofol infusion, Treatment may vary due to age or medical condition and Midazolam  Airway Management Planned: Oral ETT  Additional Equipment:   Intra-op Plan:   Post-operative Plan: Extubation in OR  Informed Consent: I have reviewed the patients History and Physical, chart, labs and discussed the procedure including the risks, benefits and alternatives for the proposed anesthesia with the patient or authorized representative who has indicated his/her understanding and acceptance.     Dental advisory given  Plan Discussed with: CRNA  Anesthesia Plan Comments:        Anesthesia Quick Evaluation

## 2020-06-06 ENCOUNTER — Ambulatory Visit (HOSPITAL_COMMUNITY): Payer: Medicare Other | Admitting: Anesthesiology

## 2020-06-06 ENCOUNTER — Encounter (HOSPITAL_COMMUNITY): Payer: Self-pay | Admitting: Surgery

## 2020-06-06 ENCOUNTER — Encounter (HOSPITAL_COMMUNITY)
Admission: RE | Disposition: A | Discharge status: Home / Self Care | Payer: Self-pay | Source: Other Acute Inpatient Hospital | Attending: Surgery

## 2020-06-06 ENCOUNTER — Ambulatory Visit (HOSPITAL_COMMUNITY)
Admission: RE | Admit: 2020-06-06 | Discharge: 2020-06-07 | Disposition: A | Discharge status: Home / Self Care | Payer: Medicare Other | Source: Other Acute Inpatient Hospital | Attending: Surgery | Admitting: Surgery

## 2020-06-06 ENCOUNTER — Other Ambulatory Visit: Payer: Self-pay

## 2020-06-06 DIAGNOSIS — K219 Gastro-esophageal reflux disease without esophagitis: Secondary | ICD-10-CM | POA: Insufficient documentation

## 2020-06-06 DIAGNOSIS — M1990 Unspecified osteoarthritis, unspecified site: Secondary | ICD-10-CM | POA: Diagnosis not present

## 2020-06-06 DIAGNOSIS — E118 Type 2 diabetes mellitus with unspecified complications: Secondary | ICD-10-CM | POA: Insufficient documentation

## 2020-06-06 DIAGNOSIS — Z90710 Acquired absence of both cervix and uterus: Secondary | ICD-10-CM | POA: Diagnosis not present

## 2020-06-06 DIAGNOSIS — E669 Obesity, unspecified: Secondary | ICD-10-CM | POA: Diagnosis not present

## 2020-06-06 DIAGNOSIS — E042 Nontoxic multinodular goiter: Secondary | ICD-10-CM | POA: Diagnosis not present

## 2020-06-06 DIAGNOSIS — Z7984 Long term (current) use of oral hypoglycemic drugs: Secondary | ICD-10-CM | POA: Insufficient documentation

## 2020-06-06 DIAGNOSIS — K8020 Calculus of gallbladder without cholecystitis without obstruction: Secondary | ICD-10-CM | POA: Insufficient documentation

## 2020-06-06 DIAGNOSIS — D34 Benign neoplasm of thyroid gland: Secondary | ICD-10-CM | POA: Insufficient documentation

## 2020-06-06 DIAGNOSIS — Z6833 Body mass index (BMI) 33.0-33.9, adult: Secondary | ICD-10-CM | POA: Insufficient documentation

## 2020-06-06 DIAGNOSIS — I10 Essential (primary) hypertension: Secondary | ICD-10-CM | POA: Insufficient documentation

## 2020-06-06 DIAGNOSIS — E210 Primary hyperparathyroidism: Secondary | ICD-10-CM | POA: Diagnosis present

## 2020-06-06 DIAGNOSIS — M549 Dorsalgia, unspecified: Secondary | ICD-10-CM | POA: Insufficient documentation

## 2020-06-06 DIAGNOSIS — Z79899 Other long term (current) drug therapy: Secondary | ICD-10-CM | POA: Diagnosis not present

## 2020-06-06 HISTORY — PX: THYROID LOBECTOMY: SHX420

## 2020-06-06 HISTORY — PX: PARATHYROIDECTOMY: SHX19

## 2020-06-06 LAB — GLUCOSE, CAPILLARY
Glucose-Capillary: 126 mg/dL — ABNORMAL HIGH (ref 70–99)
Glucose-Capillary: 135 mg/dL — ABNORMAL HIGH (ref 70–99)

## 2020-06-06 SURGERY — PARATHYROIDECTOMY
Anesthesia: General | Laterality: Right

## 2020-06-06 MED ORDER — FENTANYL CITRATE (PF) 250 MCG/5ML IJ SOLN
INTRAMUSCULAR | Status: AC
  Filled 2020-06-06: qty 5

## 2020-06-06 MED ORDER — METFORMIN HCL ER 500 MG PO TB24
1000.0000 mg | ORAL_TABLET | Freq: Every day | ORAL | Status: DC
  Administered 2020-06-07: 1000 mg via ORAL
  Filled 2020-06-06: qty 2

## 2020-06-06 MED ORDER — TRAMADOL HCL 50 MG PO TABS
50.0000 mg | ORAL_TABLET | Freq: Four times a day (QID) | ORAL | Status: DC | PRN
  Administered 2020-06-06 (×2): 50 mg via ORAL
  Filled 2020-06-06 (×2): qty 1

## 2020-06-06 MED ORDER — LISINOPRIL 10 MG PO TABS
10.0000 mg | ORAL_TABLET | Freq: Every day | ORAL | Status: DC
  Administered 2020-06-07: 10 mg via ORAL
  Filled 2020-06-06: qty 1

## 2020-06-06 MED ORDER — BUPIVACAINE HCL 0.25 % IJ SOLN
INTRAMUSCULAR | Status: DC | PRN
  Administered 2020-06-06: 13 mL

## 2020-06-06 MED ORDER — CHLORHEXIDINE GLUCONATE 0.12 % MT SOLN
15.0000 mL | Freq: Once | OROMUCOSAL | Status: AC
  Administered 2020-06-06: 15 mL via OROMUCOSAL

## 2020-06-06 MED ORDER — PHENYLEPHRINE 40 MCG/ML (10ML) SYRINGE FOR IV PUSH (FOR BLOOD PRESSURE SUPPORT)
PREFILLED_SYRINGE | INTRAVENOUS | Status: DC | PRN
  Administered 2020-06-06: 120 ug via INTRAVENOUS
  Administered 2020-06-06 (×2): 80 ug via INTRAVENOUS
  Administered 2020-06-06: 40 ug via INTRAVENOUS

## 2020-06-06 MED ORDER — ONDANSETRON HCL 4 MG/2ML IJ SOLN
INTRAMUSCULAR | Status: DC | PRN
  Administered 2020-06-06: 4 mg via INTRAVENOUS

## 2020-06-06 MED ORDER — ONDANSETRON 4 MG PO TBDP: 4.0000 mg | ORAL_TABLET | Freq: Four times a day (QID) | ORAL | Status: DC | PRN

## 2020-06-06 MED ORDER — PROPOFOL 10 MG/ML IV BOLUS
INTRAVENOUS | Status: DC | PRN
  Administered 2020-06-06: 150 mg via INTRAVENOUS

## 2020-06-06 MED ORDER — LACTATED RINGERS IV SOLN: INTRAVENOUS | Status: DC

## 2020-06-06 MED ORDER — OXYCODONE HCL 5 MG PO TABS: 5.0000 mg | ORAL_TABLET | ORAL | Status: DC | PRN

## 2020-06-06 MED ORDER — ACETAMINOPHEN 325 MG PO TABS
650.0000 mg | ORAL_TABLET | Freq: Four times a day (QID) | ORAL | Status: DC | PRN
  Administered 2020-06-06 – 2020-06-07 (×2): 650 mg via ORAL
  Filled 2020-06-06 (×3): qty 2

## 2020-06-06 MED ORDER — EPHEDRINE SULFATE 50 MG/ML IJ SOLN
INTRAMUSCULAR | Status: DC | PRN
  Administered 2020-06-06 (×2): 10 mg via INTRAVENOUS

## 2020-06-06 MED ORDER — PHENYLEPHRINE 40 MCG/ML (10ML) SYRINGE FOR IV PUSH (FOR BLOOD PRESSURE SUPPORT)
PREFILLED_SYRINGE | INTRAVENOUS | Status: AC
  Filled 2020-06-06: qty 10

## 2020-06-06 MED ORDER — ONDANSETRON HCL 4 MG/2ML IJ SOLN
INTRAMUSCULAR | Status: AC
  Filled 2020-06-06: qty 2

## 2020-06-06 MED ORDER — DEXAMETHASONE SODIUM PHOSPHATE 10 MG/ML IJ SOLN
INTRAMUSCULAR | Status: AC
  Filled 2020-06-06: qty 1

## 2020-06-06 MED ORDER — CEFAZOLIN SODIUM-DEXTROSE 2-4 GM/100ML-% IV SOLN
2.0000 g | INTRAVENOUS | Status: AC
  Administered 2020-06-06: 2 g via INTRAVENOUS
  Filled 2020-06-06: qty 100

## 2020-06-06 MED ORDER — DEXAMETHASONE SODIUM PHOSPHATE 10 MG/ML IJ SOLN
INTRAMUSCULAR | Status: DC | PRN
  Administered 2020-06-06: 10 mg via INTRAVENOUS

## 2020-06-06 MED ORDER — FENTANYL CITRATE (PF) 100 MCG/2ML IJ SOLN: 25.0000 ug | INTRAMUSCULAR | Status: DC | PRN

## 2020-06-06 MED ORDER — ORAL CARE MOUTH RINSE: 15.0000 mL | Freq: Once | OROMUCOSAL | Status: AC

## 2020-06-06 MED ORDER — ACETAMINOPHEN 500 MG PO TABS
1000.0000 mg | ORAL_TABLET | Freq: Once | ORAL | Status: AC
  Administered 2020-06-06: 1000 mg via ORAL
  Filled 2020-06-06: qty 2

## 2020-06-06 MED ORDER — SODIUM CHLORIDE 0.45 % IV SOLN: INTRAVENOUS | Status: DC

## 2020-06-06 MED ORDER — LIDOCAINE HCL (CARDIAC) PF 100 MG/5ML IV SOSY
PREFILLED_SYRINGE | INTRAVENOUS | Status: DC | PRN
  Administered 2020-06-06: 60 mg via INTRAVENOUS

## 2020-06-06 MED ORDER — ROCURONIUM BROMIDE 100 MG/10ML IV SOLN
INTRAVENOUS | Status: DC | PRN
  Administered 2020-06-06: 40 mg via INTRAVENOUS
  Administered 2020-06-06: 20 mg via INTRAVENOUS

## 2020-06-06 MED ORDER — ACETAMINOPHEN 650 MG RE SUPP: 650.0000 mg | Freq: Four times a day (QID) | RECTAL | Status: DC | PRN

## 2020-06-06 MED ORDER — CHLORHEXIDINE GLUCONATE CLOTH 2 % EX PADS: 6.0000 | MEDICATED_PAD | Freq: Once | CUTANEOUS | Status: DC

## 2020-06-06 MED ORDER — BUPIVACAINE HCL 0.25 % IJ SOLN
INTRAMUSCULAR | Status: AC
  Filled 2020-06-06: qty 1

## 2020-06-06 MED ORDER — LIDOCAINE HCL 2 % IJ SOLN
INTRAMUSCULAR | Status: AC
  Filled 2020-06-06: qty 20

## 2020-06-06 MED ORDER — PROPOFOL 500 MG/50ML IV EMUL
INTRAVENOUS | Status: DC | PRN
  Administered 2020-06-06: 75 ug/kg/min via INTRAVENOUS

## 2020-06-06 MED ORDER — HYDROMORPHONE HCL 1 MG/ML IJ SOLN: 1.0000 mg | INTRAMUSCULAR | Status: DC | PRN

## 2020-06-06 MED ORDER — SUGAMMADEX SODIUM 200 MG/2ML IV SOLN
INTRAVENOUS | Status: DC | PRN
  Administered 2020-06-06: 200 mg via INTRAVENOUS

## 2020-06-06 MED ORDER — ONDANSETRON HCL 4 MG/2ML IJ SOLN: 4.0000 mg | Freq: Four times a day (QID) | INTRAMUSCULAR | Status: DC | PRN

## 2020-06-06 MED ORDER — LIDOCAINE 20MG/ML (2%) 15 ML SYRINGE OPTIME
INTRAMUSCULAR | Status: DC | PRN
Start: 2020-06-06 — End: 2020-06-06
  Administered 2020-06-06: 1.5 mg/kg/h via INTRAVENOUS

## 2020-06-06 MED ORDER — ONDANSETRON HCL 4 MG/2ML IJ SOLN: 4.0000 mg | Freq: Once | INTRAMUSCULAR | Status: DC | PRN

## 2020-06-06 MED ORDER — 0.9 % SODIUM CHLORIDE (POUR BTL) OPTIME
TOPICAL | Status: DC | PRN
  Administered 2020-06-06: 1000 mL

## 2020-06-06 MED ORDER — SPIRONOLACTONE 25 MG PO TABS
25.0000 mg | ORAL_TABLET | Freq: Every day | ORAL | Status: DC
  Administered 2020-06-07: 25 mg via ORAL
  Filled 2020-06-06: qty 1

## 2020-06-06 MED ORDER — FENTANYL CITRATE (PF) 250 MCG/5ML IJ SOLN
INTRAMUSCULAR | Status: DC | PRN
  Administered 2020-06-06: 50 ug via INTRAVENOUS
  Administered 2020-06-06: 100 ug via INTRAVENOUS

## 2020-06-06 MED ORDER — PROPOFOL 1000 MG/100ML IV EMUL
INTRAVENOUS | Status: AC
  Filled 2020-06-06: qty 100

## 2020-06-06 MED ORDER — ATENOLOL 25 MG PO TABS
25.0000 mg | ORAL_TABLET | Freq: Every day | ORAL | Status: DC
  Administered 2020-06-07: 25 mg via ORAL
  Filled 2020-06-06: qty 1

## 2020-06-06 SURGICAL SUPPLY — 32 items
ATTRACTOMAT 16X20 MAGNETIC DRP (DRAPES) ×2 IMPLANT
BLADE SURG 15 STRL LF DISP TIS (BLADE) ×1 IMPLANT
BLADE SURG 15 STRL SS (BLADE) ×2
CHLORAPREP W/TINT 26 (MISCELLANEOUS) ×2 IMPLANT
CLIP VESOCCLUDE MED 6/CT (CLIP) ×6 IMPLANT
CLIP VESOCCLUDE SM WIDE 6/CT (CLIP) ×8 IMPLANT
COVER SURGICAL LIGHT HANDLE (MISCELLANEOUS) ×2 IMPLANT
COVER WAND RF STERILE (DRAPES) ×2 IMPLANT
DERMABOND ADVANCED (GAUZE/BANDAGES/DRESSINGS) ×1
DERMABOND ADVANCED .7 DNX12 (GAUZE/BANDAGES/DRESSINGS) ×1 IMPLANT
DRAPE LAPAROTOMY T 98X78 PEDS (DRAPES) ×2 IMPLANT
ELECT PENCIL ROCKER SW 15FT (MISCELLANEOUS) ×2 IMPLANT
ELECT REM PT RETURN 15FT ADLT (MISCELLANEOUS) ×2 IMPLANT
GAUZE 4X4 16PLY RFD (DISPOSABLE) ×2 IMPLANT
GLOVE SURG ORTHO 8.0 STRL STRW (GLOVE) ×2 IMPLANT
GOWN STRL REUS W/TWL LRG LVL3 (GOWN DISPOSABLE) ×2 IMPLANT
GOWN STRL REUS W/TWL XL LVL3 (GOWN DISPOSABLE) ×4 IMPLANT
HEMOSTAT SURGICEL 2X4 FIBR (HEMOSTASIS) ×2 IMPLANT
ILLUMINATOR WAVEGUIDE N/F (MISCELLANEOUS) ×2 IMPLANT
KIT BASIN OR (CUSTOM PROCEDURE TRAY) ×2 IMPLANT
KIT TURNOVER KIT A (KITS) IMPLANT
NEEDLE HYPO 25X1 1.5 SAFETY (NEEDLE) ×2 IMPLANT
PACK BASIC VI WITH GOWN DISP (CUSTOM PROCEDURE TRAY) ×2 IMPLANT
PENCIL SMOKE EVACUATOR (MISCELLANEOUS) IMPLANT
SHEARS HARMONIC 9CM CVD (BLADE) ×2 IMPLANT
SUT MNCRL AB 4-0 PS2 18 (SUTURE) ×2 IMPLANT
SUT VIC AB 3-0 SH 18 (SUTURE) ×4 IMPLANT
SYR BULB IRRIG 60ML STRL (SYRINGE) ×2 IMPLANT
SYR CONTROL 10ML LL (SYRINGE) ×2 IMPLANT
TOWEL OR 17X26 10 PK STRL BLUE (TOWEL DISPOSABLE) ×2 IMPLANT
TOWEL OR NON WOVEN STRL DISP B (DISPOSABLE) ×2 IMPLANT
TUBING CONNECTING 10 (TUBING) ×2 IMPLANT

## 2020-06-06 NOTE — Interval H&P Note (Signed)
History and Physical Interval Note:  06/06/2020 6:54 AM  Nicole [name]  has presented today for surgery, with the diagnosis of PRIMARY HYPERPARATHYROIDISM, RIGHT THYROID NODULE.  The various methods of treatment have been discussed with the patient and family. After consideration of risks, benefits and other options for treatment, the patient has consented to    Procedure(s): RIGHT PARATHYROIDECTOMY (Right) RIGHT THYROID LOBECTOMY (Right) as a surgical intervention.    The patient's history has been reviewed, patient examined, no change in status, stable for surgery.  I have reviewed the patient's chart and labs.  Questions were answered to the patient's satisfaction.    Armandina Gemma, MD [facility] Office: [place]

## 2020-06-06 NOTE — Anesthesia Procedure Notes (Signed)
Procedure Name: Intubation Date/Time: 06/06/2020 7:23 AM Performed by: Glory Buff, CRNA Pre-anesthesia Checklist: Patient identified, Emergency Drugs available, Suction available and Patient being monitored Patient Re-evaluated:Patient Re-evaluated prior to induction Oxygen Delivery Method: Circle system utilized Preoxygenation: Pre-oxygenation with 100% oxygen Induction Type: IV induction Ventilation: Mask ventilation without difficulty Laryngoscope Size: Miller and 3 Grade View: Grade I Tube type: Oral Tube size: 7.0 mm Number of attempts: 1 Airway Equipment and Method: Stylet and Oral airway Placement Confirmation: ETT inserted through vocal cords under direct vision,  positive ETCO2 and breath sounds checked- equal and bilateral Secured at: 20 cm Tube secured with: Tape Dental Injury: Teeth and Oropharynx as per pre-operative assessment

## 2020-06-06 NOTE — Op Note (Signed)
Operative Note  Pre-operative Diagnosis:  Primary hyperparathyroidism, right thyroid nodule  Post-operative Diagnosis:  same  Surgeon:  Armandina Gemma, MD  Assistant:  none   Procedure:  Right thyroid lobectomy, right parathyroidectomy (superior and inferior glands)  Anesthesia:  general  Estimated Blood Loss:  minimal  Drains: none         Specimen: right thyroid lobe, right superior and inferior parathyroid glands to pathology  Indications:  Patient has undergone 3 separate attempts at fine-needle aspiration biopsy of the nodule in the right inferior thyroid lobe measuring 2 cm in size. All 3 attempts have rendered scant follicular epithelium, insufficient quantity for evaluation. Patient does have a positive nuclear medicine parathyroid scan which localizes a right-sided parathyroid adenoma. Patient presents today to proceed with surgery for parathyroidectomy and right thyroid lobectomy for evaluation and definitive diagnosis of the inferior right thyroid nodule.   Procedure:  The patient was seen in the pre-op holding area. The risks, benefits, complications, treatment options, and expected outcomes were previously discussed with the patient. The patient agreed with the proposed plan and has signed the informed consent form.  The patient was brought to the operating room by the surgical team, identified as Mliss Sax Maselli and the procedure verified. A "time out" was completed and the above information confirmed.  Following administration of general endotracheal anesthesia, the patient is positioned and then prepped and draped in the usual aseptic fashion.  After ascertaining that an adequate level of anesthesia been achieved, a Kocher incision is made with a #15 blade.  Dissection was carried through subcutaneous tissues and platysma.  Hemostasis is achieved with the electrocautery.  Skin flaps are elevated cephalad and caudad.  A Mahorner self-retaining retractors placed for  exposure.  Strap muscles are incised in the midline.  Examination of the left thyroid lobe shows it to be grossly normal.  Dissection is then begun on the right side.  Strap muscles are reflected laterally exposing the right thyroid lobe.  Thyroid parenchyma is dark and soft and irregular.  There is a dominant nodule in the inferior pole measuring about 2 cm in greatest dimension.  Gland is gently mobilized.  Venous tributaries are divided between small and medium ligaclips.  Superior pole vessels are dissected out and divided between small and medium ligaclips with the harmonic scalpel.  Gland is rolled anteriorly.  Right superior parathyroid gland is identified on the surface of the thyroid.  It is abnormally enlarged.  A second parathyroid gland is identified on the capsule of the inferior pole of the thyroid.  It also appears enlarged and firm.  Gland is rolled further anteriorly.  Branches of the inferior thyroid artery are divided between small ligaclips.  Recurrent laryngeal nerve is identified and preserved.  Inferior venous tributaries are divided between ligaclips with the harmonic scalpel.  Gland is rolled further anteriorly and the ligament of Gwenlyn Found is released with the electrocautery.  Gland is rolled onto the anterior trachea and across the midline.  The isthmus is transected at the junction with the left thyroid lobe using the harmonic scalpel and the entire gland is excised.  The right superior parathyroid gland is removed from the thyroid lobe and submitted for frozen section.  The right inferior parathyroid gland is also removed from the thyroid lobe and submitted for frozen section.  Both of these are reviewed by Dr. Claudette Laws.  Both of these glands appear fat depleted, enlarged, and may both represent parathyroid adenomas.  The dominant nodule in the  inferior right thyroid lobe is also submitted for frozen section.  This shows a follicular lesion of thyroid origin.  Additional diagnosis  will depend on permanent sectioning.  Right neck is irrigated with warm saline.  Good hemostasis is achieved.  Fibrillar is placed throughout the operative field.  Strap muscles are reapproximated in the midline of interrupted 3-0 Vicryl sutures.  Platysma was closed with interrupted 3-0 Vicryl sutures.  Skin is closed with a running 4-0 Monocryl subcuticular suture.  Wound is washed and dried and Dermabond is applied as dressing.  Patient is awakened from anesthesia and transported to the recovery room.  The patient tolerated the procedure well.   Armandina Gemma, MD [facility] Office: [phone]

## 2020-06-06 NOTE — Transfer of Care (Signed)
Immediate Anesthesia Transfer of Care Note  Patient: Nicole [name]  Procedure(s) Performed: RIGHT PARATHYROIDECTOMY (Right ) RIGHT THYROID LOBECTOMY (Right )  Patient Location: PACU  Anesthesia Type:General  Level of Consciousness: drowsy, patient cooperative and responds to stimulation  Airway & Oxygen Therapy: Patient Spontanous Breathing and Patient connected to face mask oxygen  Post-op Assessment: Report given to RN and Post -op Vital signs reviewed and stable  Post vital signs: Reviewed and stable  Last Vitals:  Vitals Value Taken Time  BP 127/77 06/06/20 0906  Temp    Pulse 72 06/06/20 0906  Resp 20 06/06/20 0906  SpO2 95 % 06/06/20 0906  Vitals shown include unvalidated device data.  Last Pain:  Vitals:   06/06/20 0550  TempSrc: Oral  PainSc: 0-No pain         Complications: No complications documented.

## 2020-06-07 ENCOUNTER — Encounter (HOSPITAL_COMMUNITY): Payer: Self-pay | Admitting: Surgery

## 2020-06-07 DIAGNOSIS — E210 Primary hyperparathyroidism: Secondary | ICD-10-CM | POA: Diagnosis not present

## 2020-06-07 LAB — BASIC METABOLIC PANEL
Anion gap: 9 (ref 5–15)
BUN: 16 mg/dL (ref 8–23)
CO2: 25 mmol/L (ref 22–32)
Calcium: 9.9 mg/dL (ref 8.9–10.3)
Chloride: 103 mmol/L (ref 98–111)
Creatinine, Ser: 0.63 mg/dL (ref 0.44–1.00)
GFR calc Af Amer: >60 mL/min (ref >60)
GFR calc non Af Amer: >60 mL/min (ref >60)
Glucose, Bld: 147 mg/dL — ABNORMAL HIGH (ref 70–99)
Potassium: 4.2 mmol/L (ref 3.5–5.1)
Sodium: 137 mmol/L (ref 135–145)

## 2020-06-07 NOTE — Discharge Summary (Signed)
Physician Discharge Summary [facility]  Patient ID: Nicole [name] MRN: [id number] DOB/AGE: [birth year] 74 y.o.  Admit date: 06/06/2020 Discharge date: 06/07/2020  Admission Diagnoses:  Primary hyperparathyroidism, thyroid nodule  Discharge Diagnoses:  Principal Problem:   Hyperparathyroidism, primary ([place]) Active Problems:   Multiple thyroid nodules   Primary hyperparathyroidism [facility])   Discharged Condition: good  Hospital Course: Patient was admitted for observation following thyroid and parathyroid surgery.  Post op course was uncomplicated.  Pain was well controlled.  Tolerated diet.  Post op calcium level on morning following surgery was 9.9 mg/dl.  Patient was prepared for discharge home on POD#1.  Consults: None  Treatments: surgery: right thyroid lobectomy, right superior and inferior parathyroidectomy  Discharge Exam: Blood pressure (!) 110/58, pulse 61, temperature (!) 97.5 F (36.4 C), temperature source Oral, resp. rate 18, height 5\' 3"  (1.6 m), weight 89.4 kg, SpO2 95 %. HEENT - clear Neck - wound dry and intact; Dermabond in place; mild STS; voice normal Chest - clear bilaterally Cor - RRR  Disposition: Home  Discharge Instructions    Diet - low sodium heart healthy   Complete by: As directed    Discharge instructions   Complete by: As directed    [facility]  THYROID & PARATHYROID SURGERY:  POST-OP INSTRUCTIONS  Always review your discharge instruction sheet from the facility where your surgery was performed.  A prescription for pain medication may be given to you upon discharge.  Take your pain medication as prescribed.  If narcotic pain medicine is not needed, then you may take acetaminophen (Tylenol) or ibuprofen (Advil) as needed.  Take your usually prescribed medications unless otherwise directed.  If you need a refill on your pain medication, please contact our office during regular business hours.   Prescriptions cannot be processed by our office after 5 pm or on weekends.  Start with a light diet upon arrival home, such as soup and crackers or toast.  Be sure to drink plenty of fluids daily.  Resume your normal diet the day after surgery.  Most patients will experience some swelling and bruising on the chest and neck area.  Ice packs will help.  Swelling and bruising can take several days to resolve.   It is common to experience some constipation after surgery.  Increasing fluid intake and taking a stool softener (Colace) will usually help or prevent this problem.  A mild laxative (Milk of Magnesia or Miralax) should be taken according to package directions if there has been no bowel movement after 48 hours.  You have steri-strips and a gauze dressing over your incision.  You may remove the gauze bandage on the second day after surgery, and you may shower at that time.  Leave your steri-strips (small skin tapes) in place directly over the incision.  These strips should remain on the skin for 5-7 days and then be removed.  You may get them wet in the shower and pat them dry.  You may resume regular (light) daily activities beginning the next day (such as daily self-care, walking, climbing stairs) gradually increasing activities as tolerated.  You may have sexual intercourse when it is comfortable.  Refrain from any heavy lifting or straining until approved by your doctor.  You may drive when you no longer are taking prescription pain medication, you can comfortably wear a seatbelt, and you can safely maneuver your car and apply brakes.  You should see your doctor in the office  for a follow-up appointment approximately three weeks after your surgery.  Make sure that you call for this appointment within a day or two after you arrive home to insure a convenient appointment time.  WHEN TO CALL YOUR DOCTOR: -- Fever greater than 101.5 -- Inability to urinate -- Nausea and/or vomiting -  persistent -- Extreme swelling or bruising -- Continued bleeding from incision -- Increased pain, redness, or drainage from the incision -- Difficulty swallowing or breathing -- Muscle cramping or spasms -- Numbness or tingling in hands or around lips  The clinic staff is available to answer your questions during regular business hours.  Please don't hesitate to call and ask to speak to one of the nurses if you have concerns.  Armandina Gemma, MD [facility] Office: [phone]   Increase activity slowly   Complete by: As directed    No dressing needed   Complete by: As directed      Allergies as of 06/07/2020   No Known Allergies     Medication List    TAKE these medications   aspirin EC 81 MG tablet Take 81 mg by mouth at bedtime. Swallow whole.   atenolol 25 MG tablet Commonly known as: TENORMIN Take 25 mg by mouth daily.   Biotin 5000 MCG Tabs Take 5,000 mcg by mouth daily.   CALCIUM 600+D3 PO Take 1 tablet by mouth in the morning and at bedtime.   furosemide 20 MG tablet Commonly known as: LASIX Take 20 mg by mouth daily as needed (fluid retention/ankle swelling).   lisinopril 10 MG tablet Commonly known as: ZESTRIL Take 1 tablet (10 mg total) by mouth daily.   Lubricant Eye Drops 0.4-0.3 % Soln Generic drug: Polyethyl Glycol-Propyl Glycol Place 1 drop into both eyes at bedtime as needed (dry eyes.).   metFORMIN 500 MG 24 hr tablet Commonly known as: GLUCOPHAGE-XR Take 1,000 mg by mouth daily.   spironolactone 25 MG tablet Commonly known as: ALDACTONE Take 1 tablet (25 mg total) by mouth daily.            Discharge Care Instructions  (From admission, onward)         Start     Ordered   06/07/20 0000  No dressing needed        06/07/20 0840          Follow-up Information    Armandina Gemma, MD. Schedule an appointment as soon as possible for a visit in 3 week(s).   Specialty: General Surgery Contact information: [address] [place] [address] [phone]               Earnstine Regal, MD, [facility] Office: [phone]   Signed: Armandina Gemma 06/07/2020, 8:41 AM

## 2020-06-07 NOTE — Progress Notes (Signed)
Pt was stable and d/c home with husband. All d/c questions were answered and they both understood all directions.

## 2020-06-07 NOTE — Anesthesia Postprocedure Evaluation (Signed)
Anesthesia Post Note  Patient: Nicole [name]  Procedure(s) Performed: RIGHT PARATHYROIDECTOMY (Right ) RIGHT THYROID LOBECTOMY (Right )     Patient location during evaluation: PACU Anesthesia Type: General Level of consciousness: awake Pain management: pain level controlled Vital Signs Assessment: post-procedure vital signs reviewed and stable Respiratory status: spontaneous breathing, nonlabored ventilation, respiratory function stable and patient connected to nasal cannula oxygen Cardiovascular status: blood pressure returned to baseline and stable Postop Assessment: no apparent nausea or vomiting Anesthetic complications: no   No complications documented.  Last Vitals:  Vitals:   06/07/20 0154 06/07/20 0508  BP: (!) 113/59 (!) 110/58  Pulse: 67 61  Resp: 18 18  Temp: 36.7 C (!) 36.4 C  SpO2: 94% 95%    Last Pain:  Vitals:   06/07/20 0508  TempSrc: Oral  PainSc:                  Karyl Kinnier Ellender

## 2020-06-09 LAB — SURGICAL PATHOLOGY

## 2020-06-10 NOTE — Progress Notes (Signed)
Please contact patient and notify of benign pathology results.  Nicole [name] M. Rosea Dory, MD, FACS [facility] Office: [phone]   

## 2020-06-20 DIAGNOSIS — H903 Sensorineural hearing loss, bilateral: Secondary | ICD-10-CM | POA: Insufficient documentation

## 2020-07-29 ENCOUNTER — Other Ambulatory Visit: Payer: Self-pay

## 2020-07-29 ENCOUNTER — Ambulatory Visit (INDEPENDENT_AMBULATORY_CARE_PROVIDER_SITE_OTHER): Payer: Medicare Other | Admitting: Podiatry

## 2020-07-29 DIAGNOSIS — L84 Corns and callosities: Secondary | ICD-10-CM | POA: Diagnosis not present

## 2020-07-29 DIAGNOSIS — E1151 Type 2 diabetes mellitus with diabetic peripheral angiopathy without gangrene: Secondary | ICD-10-CM | POA: Diagnosis not present

## 2020-08-19 DIAGNOSIS — E559 Vitamin D deficiency, unspecified: Secondary | ICD-10-CM | POA: Insufficient documentation

## 2020-08-19 DIAGNOSIS — E039 Hypothyroidism, unspecified: Secondary | ICD-10-CM | POA: Insufficient documentation

## 2020-08-21 NOTE — Progress Notes (Signed)
  Subjective:  Patient ID: Nicole [name], female    DOB: Jul 24, 1946,  MRN: [id number]  Chief Complaint  Patient presents with  . Diabetes    foot care    74 y.o. female presents with the above complaint. History confirmed with patient.   Objective:  Physical Exam: warm, good capillary refill, nail exam normal nails without lesions, no trophic changes or ulcerative lesions. DP pulses palpable and protective sensation intact. PT non-palp  Right Foot: HPK 5th met base   No images are attached to the encounter.  Assessment:   1. Diabetes mellitus type 2 with peripheral artery disease (HCC)   2. Callus     Plan:  Patient was evaluated and treated and all questions answered.  Diabetes, HPK -Lesion pared.   Procedure: Paring of Lesion Rationale: painful hyperkeratotic lesion Type of Debridement: manual, sharp debridement. Instrumentation: 312 blade Number of Lesions: 1   No follow-ups on file.

## 2020-10-28 ENCOUNTER — Ambulatory Visit (INDEPENDENT_AMBULATORY_CARE_PROVIDER_SITE_OTHER): Payer: Medicare Other | Admitting: Podiatry

## 2020-10-28 ENCOUNTER — Other Ambulatory Visit: Payer: Self-pay

## 2020-10-28 DIAGNOSIS — L84 Corns and callosities: Secondary | ICD-10-CM

## 2020-10-28 DIAGNOSIS — B351 Tinea unguium: Secondary | ICD-10-CM | POA: Diagnosis not present

## 2020-10-28 DIAGNOSIS — E1142 Type 2 diabetes mellitus with diabetic polyneuropathy: Secondary | ICD-10-CM

## 2020-10-28 NOTE — Progress Notes (Signed)
  Subjective:  Patient ID: Nicole [name], female    DOB: [birth year],  MRN: [id number]  Chief Complaint  Patient presents with  . Diabetes    nail trim needed, soak applied to toenails d/t PAD   . Callouses    noted to right foot lateral side    74 y.o. female presents with the above complaint. History confirmed with patient.   Objective:  Physical Exam: warm, good capillary refill, nail exam thickened dystrophic nails without lesions, no trophic changes or ulcerative lesions. DP pulses palpable and protective sensation intact. PT non-palp  Right Foot: HPK 5th met base   No images are attached to the encounter.  Assessment:   1. DM type 2 with diabetic peripheral neuropathy (HCC)   2. Callus   3. Onychomycosis     Plan:  Patient was evaluated and treated and all questions answered.  Diabetes, HPK, onychomycosis -Nails debrided x10 -Lesion pared.    Procedure: Nail Debridement Type of Debridement: manual, sharp debridement. Instrumentation: Nail nipper, rotary burr. Number of Nails: 10   Procedure: Paring of Lesion Rationale: painful hyperkeratotic lesion Type of Debridement: manual, sharp debridement. Instrumentation: 312 blade Number of Lesions: 1   No follow-ups on file.

## 2021-01-27 ENCOUNTER — Other Ambulatory Visit: Payer: Self-pay

## 2021-01-27 ENCOUNTER — Ambulatory Visit (INDEPENDENT_AMBULATORY_CARE_PROVIDER_SITE_OTHER): Payer: Medicare Other | Admitting: Podiatry

## 2021-01-27 DIAGNOSIS — E1142 Type 2 diabetes mellitus with diabetic polyneuropathy: Secondary | ICD-10-CM | POA: Diagnosis not present

## 2021-01-27 DIAGNOSIS — L84 Corns and callosities: Secondary | ICD-10-CM

## 2021-01-27 DIAGNOSIS — B351 Tinea unguium: Secondary | ICD-10-CM

## 2021-01-27 NOTE — Progress Notes (Signed)
  Subjective:  Patient ID: Nicole [name], female    DOB: 04-07-1946,  MRN: [id number]  Chief Complaint  Patient presents with  . diabetic foot care    Diabetic foot care   75 y.o. female presents with the above complaint. History confirmed with patient.   Objective:  Physical Exam: warm, good capillary refill, nail exam thickened dystrophic nails without lesions, no trophic changes or ulcerative lesions. DP pulses palpable and protective sensation intact. PT non-palp  Right Foot: HPK 5th met base   No images are attached to the encounter.  Assessment:   1. DM type 2 with diabetic peripheral neuropathy (HCC)   2. Callus   3. Onychomycosis     Plan:  Patient was evaluated and treated and all questions answered.  Diabetes, HPK, onychomycosis -Nails debrided x10 -Lesion pared R foot  Procedure: Paring of Lesion Rationale: painful hyperkeratotic lesion Type of Debridement: manual, sharp debridement. Instrumentation: 312 blade Number of Lesions: 1   Procedure: Nail Debridement Type of Debridement: manual, sharp debridement. Instrumentation: Nail nipper, rotary burr. Number of Nails: 10  No follow-ups on file.

## 2021-04-30 ENCOUNTER — Other Ambulatory Visit: Payer: Self-pay

## 2021-05-01 ENCOUNTER — Ambulatory Visit (INDEPENDENT_AMBULATORY_CARE_PROVIDER_SITE_OTHER): Payer: Medicare Other | Admitting: Podiatry

## 2021-05-01 ENCOUNTER — Other Ambulatory Visit: Payer: Self-pay

## 2021-05-01 DIAGNOSIS — E1142 Type 2 diabetes mellitus with diabetic polyneuropathy: Secondary | ICD-10-CM

## 2021-05-01 DIAGNOSIS — L84 Corns and callosities: Secondary | ICD-10-CM | POA: Diagnosis not present

## 2021-05-01 NOTE — Progress Notes (Signed)
  Subjective:  Patient ID: Nicole [name], female    DOB: 09-Jul-1946,  MRN: [id number]  No chief complaint on file.  75 y.o. female presents for care of her right foot callus  Objective:  Physical Exam: warm, good capillary refill, nail exam thickened dystrophic nails without lesions, no trophic changes or ulcerative lesions. DP pulses palpable and protective sensation intact. PT non-palp  Right Foot: HPK 5th met base   No images are attached to the encounter.  Assessment:   1. DM type 2 with diabetic peripheral neuropathy ([place])   2. Callus    Plan:  Patient was evaluated and treated and all questions answered.  Diabetes, HPK, onychomycosis -Nail not needed to be debrided today. Callus pared  Procedure: Paring of Lesion Rationale: painful hyperkeratotic lesion Type of Debridement: manual, sharp debridement. Instrumentation: 312 blade Number of Lesions: 1   Return in about 3 months (around 08/01/2021) for Diabetic Foot Care.

## 2021-08-04 ENCOUNTER — Ambulatory Visit (INDEPENDENT_AMBULATORY_CARE_PROVIDER_SITE_OTHER): Payer: Medicare Other | Admitting: Podiatry

## 2021-08-04 ENCOUNTER — Other Ambulatory Visit: Payer: Self-pay

## 2021-08-04 DIAGNOSIS — E1142 Type 2 diabetes mellitus with diabetic polyneuropathy: Secondary | ICD-10-CM | POA: Diagnosis not present

## 2021-08-04 DIAGNOSIS — L84 Corns and callosities: Secondary | ICD-10-CM | POA: Diagnosis not present

## 2021-08-04 NOTE — Progress Notes (Signed)
  Subjective:  Patient ID: Nicole [name], female    DOB: August 23, 1946,  MRN: [id number]  Chief Complaint  Patient presents with   Nail Problem      3 month f/u  Diabetic Foot Care   Diabetes   Peripheral Neuropathy    75 y.o. female presents for care of her right foot callus  Objective:  Physical Exam: warm, good capillary refill, nail exam thickened dystrophic nails without lesions, no trophic changes or ulcerative lesions. DP pulses palpable and protective sensation intact. PT non-palp  Right Foot: HPK 5th met base   No images are attached to the encounter.  Assessment:   1. DM type 2 with diabetic peripheral neuropathy ([place])   2. Callus     Plan:  Patient was evaluated and treated and all questions answered.  Diabetes, HPK, onychomycosis -Debrided HPK, no nail care per patient request  Procedure: Paring of Lesion Rationale: painful hyperkeratotic lesion Type of Debridement: manual, sharp debridement. Instrumentation: 312 blade Number of Lesions: 1   No follow-ups on file.

## 2021-11-03 ENCOUNTER — Ambulatory Visit: Payer: Medicare Other | Admitting: Podiatry

## 2021-12-03 ENCOUNTER — Other Ambulatory Visit: Payer: Self-pay | Admitting: Internal Medicine

## 2021-12-03 DIAGNOSIS — Z1231 Encounter for screening mammogram for malignant neoplasm of breast: Secondary | ICD-10-CM

## 2021-12-08 ENCOUNTER — Ambulatory Visit (INDEPENDENT_AMBULATORY_CARE_PROVIDER_SITE_OTHER): Payer: Medicare Other | Admitting: Podiatry

## 2021-12-08 ENCOUNTER — Ambulatory Visit
Admission: RE | Admit: 2021-12-08 | Discharge: 2021-12-08 | Disposition: A | Discharge status: Home / Self Care | Payer: Medicare Other | Source: Ambulatory Visit | Attending: Internal Medicine | Admitting: Internal Medicine

## 2021-12-08 ENCOUNTER — Other Ambulatory Visit: Payer: Self-pay

## 2021-12-08 ENCOUNTER — Other Ambulatory Visit: Payer: Self-pay | Admitting: Family Medicine

## 2021-12-08 DIAGNOSIS — L84 Corns and callosities: Secondary | ICD-10-CM

## 2021-12-08 DIAGNOSIS — B351 Tinea unguium: Secondary | ICD-10-CM

## 2021-12-08 DIAGNOSIS — E1142 Type 2 diabetes mellitus with diabetic polyneuropathy: Secondary | ICD-10-CM | POA: Diagnosis not present

## 2021-12-08 DIAGNOSIS — Z1231 Encounter for screening mammogram for malignant neoplasm of breast: Secondary | ICD-10-CM

## 2021-12-08 NOTE — Progress Notes (Signed)
Subjective:  Patient ID: Nicole [name], female    DOB: 01/21/1946,  MRN: [id number]  Chief Complaint  Patient presents with   Callouses    Patient is requesting debridement as well as callous shaved down on right foot.     76 y.o. female presents for callus care. States it has been an additional month since she was seen. Denies new issues. Denies need for nail care today  Objective:  Physical Exam: warm, good capillary refill, no trophic changes or ulcerative lesions. DP pulses palpable and protective sensation intact. PT non-palp  Right Foot: HPK 5th met base   No images are attached to the encounter.  Assessment:   1. DM type 2 with diabetic peripheral neuropathy ([place])   2. Onychomycosis   3. Callus     Plan:  Patient was evaluated and treated and all questions answered.  Diabetes, HPK, onychomycosis -Callus debrided. Small amount of dried blood but no open ulceration  Procedure: Callus Debridement  Type of Debridement: sharp Instrumentation: 312 blade Number of Calluses: 1   No follow-ups on file.

## 2021-12-09 ENCOUNTER — Other Ambulatory Visit: Payer: Self-pay | Admitting: Internal Medicine

## 2021-12-09 DIAGNOSIS — Z780 Asymptomatic menopausal state: Secondary | ICD-10-CM

## 2022-03-09 ENCOUNTER — Ambulatory Visit (INDEPENDENT_AMBULATORY_CARE_PROVIDER_SITE_OTHER): Payer: Medicare Other | Admitting: Podiatry

## 2022-03-09 ENCOUNTER — Ambulatory Visit: Payer: 59 | Admitting: Podiatry

## 2022-03-09 DIAGNOSIS — E1142 Type 2 diabetes mellitus with diabetic polyneuropathy: Secondary | ICD-10-CM | POA: Diagnosis not present

## 2022-03-09 DIAGNOSIS — L84 Corns and callosities: Secondary | ICD-10-CM

## 2022-03-09 NOTE — Patient Instructions (Signed)
Diabetes Mellitus and Foot Care Foot care is an important part of your health, especially when you have diabetes. Diabetes may cause you to have problems because of poor blood flow (circulation) to your feet and legs, which can cause your skin to: Become thinner and drier. Break more easily. Heal more slowly. Peel and crack. You may also have nerve damage (neuropathy) in your legs and feet, causing decreased feeling in them. This means that you may not notice minor injuries to your feet that could lead to more serious problems. Noticing and addressing any potential problems early is the best way to prevent future foot problems. How to care for your feet Foot hygiene  Wash your feet daily with warm water and mild soap. Do not use hot water. Then, pat your feet and the areas between your toes until they are completely dry. Do not soak your feet as this can dry your skin. Trim your toenails straight across. Do not dig under them or around the cuticle. File the edges of your nails with an emery board or nail file. Apply a moisturizing lotion or petroleum jelly to the skin on your feet and to dry, brittle toenails. Use lotion that does not contain alcohol and is unscented. Do not apply lotion between your toes. Shoes and socks Wear clean socks or stockings every day. Make sure they are not too tight. Do not wear knee-high stockings since they may decrease blood flow to your legs. Wear shoes that fit properly and have enough cushioning. Always look in your shoes before you put them on to be sure there are no objects inside. To break in new shoes, wear them for just a few hours a day. This prevents injuries on your feet. Wounds, scrapes, corns, and calluses  Check your feet daily for blisters, cuts, bruises, sores, and redness. If you cannot see the bottom of your feet, use a mirror or ask someone for help. Do not cut corns or calluses or try to remove them with medicine. If you find a minor scrape,  cut, or break in the skin on your feet, keep it and the skin around it clean and dry. You may clean these areas with mild soap and water. Do not clean the area with peroxide, alcohol, or iodine. If you have a wound, scrape, corn, or callus on your foot, look at it several times a day to make sure it is healing and not infected. Check for: Redness, swelling, or pain. Fluid or blood. Warmth. Pus or a bad smell. General tips Do not cross your legs. This may decrease blood flow to your feet. Do not use heating pads or hot water bottles on your feet. They may burn your skin. If you have lost feeling in your feet or legs, you may not know this is happening until it is too late. Protect your feet from hot and cold by wearing shoes, such as at the beach or on hot pavement. Schedule a complete foot exam at least once a year (annually) or more often if you have foot problems. Report any cuts, sores, or bruises to your health care provider immediately. Where to find more information American Diabetes Association: www.diabetes.org Association of Diabetes Care & Education Specialists: www.diabeteseducator.org Contact a health care provider if: You have a medical condition that increases your risk of infection and you have any cuts, sores, or bruises on your feet. You have an injury that is not healing. You have redness on your legs or feet. You   feel burning or tingling in your legs or feet. You have pain or cramps in your legs and feet. Your legs or feet are numb. Your feet always feel cold. You have pain around any toenails. Get help right away if: You have a wound, scrape, corn, or callus on your foot and: You have pain, swelling, or redness that gets worse. You have fluid or blood coming from the wound, scrape, corn, or callus. Your wound, scrape, corn, or callus feels warm to the touch. You have pus or a bad smell coming from the wound, scrape, corn, or callus. You have a fever. You have a red  line going up your leg. Summary Check your feet every day for blisters, cuts, bruises, sores, and redness. Apply a moisturizing lotion or petroleum jelly to the skin on your feet and to dry, brittle toenails. Wear shoes that fit properly and have enough cushioning. If you have foot problems, report any cuts, sores, or bruises to your health care provider immediately. Schedule a complete foot exam at least once a year (annually) or more often if you have foot problems. This information is not intended to replace advice given to you by your health care provider. Make sure you discuss any questions you have with your health care provider. Document Revised: 05/29/2020 Document Reviewed: 05/29/2020 Elsevier Patient Education  2023 Elsevier Inc.  

## 2022-03-09 NOTE — Progress Notes (Signed)
Subjective: ?76 year old female presents the office today for follow-up evaluation of a callus inside of her right foot that is to be shaved.  She denies any swelling redness or drainage.  No open lesions that she reports.  No new concerns today. ? ?Last A1c was 5.5  ? ?Objective: ?AAO x3, NAD ?DP pulses 2/4, PT pulse 1/4. ?(Previously documented neuropathy)  ?Hyperkeratotic lesion noted fifth metatarsal base without any underlying ulceration drainage or signs of infection.  Some dried blood present however there is no underlying ulceration in the area is preulcerative.  No edema, erythema.   ?No pain with calf compression, swelling, warmth, erythema ? ?Assessment: ?Pre-ulcerative callus right foot ? ?Plan: ?-All treatment options discussed with the patient including all alternatives, risks, complications.  ?-Sharply debrided the hyperkeratotic lesion x 1 without any complications or bleeding. Continue moisturizer and offloading daily.  ?-Patient encouraged to call the office with any questions, concerns, change in symptoms.  ? ?Trula Slade DPM ? ?

## 2022-04-27 ENCOUNTER — Ambulatory Visit
Admission: RE | Admit: 2022-04-27 | Discharge: 2022-04-27 | Disposition: A | Discharge status: Home / Self Care | Payer: Medicare Other | Source: Ambulatory Visit | Attending: Internal Medicine | Admitting: Internal Medicine

## 2022-04-27 DIAGNOSIS — Z780 Asymptomatic menopausal state: Secondary | ICD-10-CM

## 2022-06-08 ENCOUNTER — Ambulatory Visit (INDEPENDENT_AMBULATORY_CARE_PROVIDER_SITE_OTHER): Payer: Medicare Other | Admitting: Podiatry

## 2022-06-08 DIAGNOSIS — L84 Corns and callosities: Secondary | ICD-10-CM | POA: Diagnosis not present

## 2022-06-08 DIAGNOSIS — E1151 Type 2 diabetes mellitus with diabetic peripheral angiopathy without gangrene: Secondary | ICD-10-CM

## 2022-06-08 NOTE — Progress Notes (Signed)
Subjective: 76 year old female presents the office today for follow-up evaluation of a callus inside of her right foot that is to be shaved.  She denies any swelling redness or drainage.    Left ankle a little "puffy" and she is on spironolactone. She thinks the hot wether has caused this. No pain. No open lesions.   No numbness or tinlging- states she has not been diagnosed with neuropathy   Last A1c was 5.7 on 03/16/2022  Objective: AAO x3, NAD DP pulses 2/4, PT pulse 1/4. Sensation intact with SWMF Hyperkeratotic lesion noted fifth metatarsal base without any underlying ulceration drainage or signs of infection.  Some dried blood present however there is no underlying ulceration in the area is preulcerative.  No edema, erythema.   No pain with calf compression, swelling, warmth, erythema  Assessment: Pre-ulcerative callus right foot  Plan: -All treatment options discussed with the patient including all alternatives, risks, complications.  -Sharply debrided the hyperkeratotic lesion x 1 without any complications or bleeding. Continue moisturizer and offloading daily.  -Patient encouraged to call the office with any questions, concerns, change in symptoms.   Trula Slade DPM

## 2022-09-09 ENCOUNTER — Ambulatory Visit (INDEPENDENT_AMBULATORY_CARE_PROVIDER_SITE_OTHER): Payer: Medicare Other | Admitting: Podiatry

## 2022-09-09 DIAGNOSIS — E1142 Type 2 diabetes mellitus with diabetic polyneuropathy: Secondary | ICD-10-CM | POA: Diagnosis not present

## 2022-09-09 DIAGNOSIS — Q828 Other specified congenital malformations of skin: Secondary | ICD-10-CM | POA: Diagnosis not present

## 2022-09-09 NOTE — Progress Notes (Signed)
Subjective: Chief Complaint  Patient presents with   Callouses    Right foot callus trim    76 year old female with the above concerns.  She the calluses become tender again.  No swelling redness or drainage. Last A1c was  5.7 on 06/22/2022  Objective: AAO x3, NAD DP pulses 2/4, PT pulse 1/4. Sensation intact with SWMF Hyperkeratotic lesion noted fifth metatarsal base without any underlying ulceration drainage or signs of infection.  Minimal dried blood present however there is no underlying ulceration in the area is preulcerative.  No edema, erythema.  Prominent fifth metatarsal base. No pain with calf compression, swelling, warmth, erythema  Assessment: Pre-ulcerative callus right foot  Plan: -All treatment options discussed with the patient including all alternatives, risks, complications.  -Sharply debrided the hyperkeratotic lesion x 1 without any complications or bleeding. Continue moisturizer and offloading daily.  Monitor for any skin breakdown. -Patient encouraged to call the office with any questions, concerns, change in symptoms.   Trula Slade DPM

## 2022-09-20 LAB — EXTERNAL GENERIC LAB PROCEDURE: COLOGUARD: NEGATIVE

## 2022-09-20 LAB — COLOGUARD: COLOGUARD: NEGATIVE

## 2022-12-10 ENCOUNTER — Ambulatory Visit (INDEPENDENT_AMBULATORY_CARE_PROVIDER_SITE_OTHER): Payer: Medicare Other | Admitting: Podiatry

## 2022-12-10 DIAGNOSIS — Q828 Other specified congenital malformations of skin: Secondary | ICD-10-CM | POA: Diagnosis not present

## 2022-12-10 DIAGNOSIS — E1142 Type 2 diabetes mellitus with diabetic polyneuropathy: Secondary | ICD-10-CM

## 2022-12-10 NOTE — Progress Notes (Signed)
Subjective: Chief Complaint  Patient presents with   Callouses    Right foot    77 year old female with the above concerns.  Calluses come back causing pain.  No swelling redness or drainage.  No open lesions.   Last A1c was 5.6 on December 08, 2022  Objective: AAO x3, NAD DP pulses 2/4, PT pulse 1/4. Sensation intact with SWMF Hyperkeratotic lesion noted fifth metatarsal base without any underlying ulceration drainage or signs of infection.  Minimal dried blood present however there is no underlying ulceration in the area is preulcerative.  No edema, erythema.  Prominent fifth metatarsal base. No pain with calf compression, swelling, warmth, erythema  Assessment: Pre-ulcerative callus right foot  Plan: -All treatment options discussed with the patient including all alternatives, risks, complications.  -Sharply debrided the hyperkeratotic lesion x 1 without any complications or bleeding. Continue moisturizer and offloading to help prevent or minimize reoccurrence.  Monitor for any skin breakdown. -Patient encouraged to call the office with any questions, concerns, change in symptoms.   Trula Slade DPM

## 2023-03-14 ENCOUNTER — Ambulatory Visit (INDEPENDENT_AMBULATORY_CARE_PROVIDER_SITE_OTHER): Payer: Medicare Other | Admitting: Podiatry

## 2023-03-14 ENCOUNTER — Encounter: Payer: Self-pay | Admitting: Podiatry

## 2023-03-14 DIAGNOSIS — Q828 Other specified congenital malformations of skin: Secondary | ICD-10-CM

## 2023-03-14 DIAGNOSIS — E1142 Type 2 diabetes mellitus with diabetic polyneuropathy: Secondary | ICD-10-CM

## 2023-03-16 NOTE — Progress Notes (Signed)
Subjective: Chief Complaint  Patient presents with   Callouses    Trim callus right     77 year old female with the above concerns.  Skin lesion, calluses has come back and she needs to have a trend of this causing discomfort.  No swelling redness or drainage.  No open lesions.   Last A1c was 5.6 on December 08, 2022  Objective: AAO x3, NAD DP pulses 2/4, PT pulse 1/4. Sensation intact with SWMF Hyperkeratotic lesion noted fifth metatarsal base without any underlying ulceration drainage or signs of infection.  Minimal dried blood present however there is no underlying ulceration in the area is preulcerative.  No edema, erythema.  Prominent fifth metatarsal base. No pain with calf compression, swelling, warmth, erythema  Assessment: Pre-ulcerative callus right foot  Plan: -All treatment options discussed with the patient including all alternatives, risks, complications.  -Sharply debrided the hyperkeratotic lesion x 1 without any complications or bleeding. Continue moisturizer and offloading to help prevent or minimize reoccurrence.  Monitor for any skin breakdown. -Patient encouraged to call the office with any questions, concerns, change in symptoms.   Vivi Barrack DPM

## 2023-03-21 ENCOUNTER — Other Ambulatory Visit: Payer: Self-pay | Admitting: Family Medicine

## 2023-03-21 DIAGNOSIS — Z1231 Encounter for screening mammogram for malignant neoplasm of breast: Secondary | ICD-10-CM

## 2023-03-31 ENCOUNTER — Ambulatory Visit
Admission: RE | Admit: 2023-03-31 | Discharge: 2023-03-31 | Disposition: A | Discharge status: Home / Self Care | Payer: Medicare Other | Source: Ambulatory Visit | Attending: Family Medicine | Admitting: Family Medicine

## 2023-03-31 DIAGNOSIS — Z1231 Encounter for screening mammogram for malignant neoplasm of breast: Secondary | ICD-10-CM

## 2023-06-13 ENCOUNTER — Encounter: Payer: Self-pay | Admitting: Podiatry

## 2023-06-13 ENCOUNTER — Ambulatory Visit (INDEPENDENT_AMBULATORY_CARE_PROVIDER_SITE_OTHER): Payer: Medicare Other | Admitting: Podiatry

## 2023-06-13 VITALS — BP 128/73 | HR 52

## 2023-06-13 DIAGNOSIS — Q828 Other specified congenital malformations of skin: Secondary | ICD-10-CM

## 2023-06-13 DIAGNOSIS — E1142 Type 2 diabetes mellitus with diabetic polyneuropathy: Secondary | ICD-10-CM | POA: Diagnosis not present

## 2023-06-19 NOTE — Progress Notes (Signed)
Subjective: Chief Complaint  Patient presents with   Diabetes    "It's the callus on my right foot that he trims every now and then."    77 year old female with the above concerns.  Skin lesion, calluses has come back and she needs to have a trend of this causing discomfort.  No new concerns today.  Last A1c was 6.1 on June 01, 2023  Objective: AAO x3, NAD DP pulses 2/4, PT pulse 1/4. Sensation intact with SWMF Hyperkeratotic lesion noted fifth metatarsal base without any underlying ulceration drainage or signs of infection.  Minimal dried blood present in a small spot so remains upon debridement.  The area is still preulcerative.  No open lesion.  No edema, erythema.  Prominent fifth metatarsal base. No pain with calf compression, swelling, warmth, erythema  Assessment: Pre-ulcerative callus right foot  Plan: -All treatment options discussed with the patient including all alternatives, risks, complications.  -Sharply debrided the hyperkeratotic lesion x 1 without any complications or bleeding. Continue moisturizer and offloading to help prevent or minimize reoccurrence.  Monitor for any skin breakdown. -Patient encouraged to call the office with any questions, concerns, change in symptoms.   Vivi Barrack DPM

## 2023-09-15 ENCOUNTER — Ambulatory Visit (INDEPENDENT_AMBULATORY_CARE_PROVIDER_SITE_OTHER): Payer: Medicare Other | Admitting: Podiatry

## 2023-09-15 ENCOUNTER — Encounter: Payer: Self-pay | Admitting: Podiatry

## 2023-09-15 DIAGNOSIS — E1142 Type 2 diabetes mellitus with diabetic polyneuropathy: Secondary | ICD-10-CM | POA: Diagnosis not present

## 2023-09-15 DIAGNOSIS — Q828 Other specified congenital malformations of skin: Secondary | ICD-10-CM

## 2023-09-18 NOTE — Progress Notes (Signed)
Subjective: Chief Complaint  Patient presents with   Callouses    PATIENT STATES HER FEET ARE DOING OK  , JUST HER FOR HER CALLOUSES TO BE CUT     77 year old female with the above concerns.  Skin lesion, calluses has come back and she needs to have a trend of this causing discomfort.  No new concerns today.  Last A1c was 6.1 on June 01, 2023 Richmond Campbell., PA-C- last seen 07/07/2023  Objective: AAO x3, NAD DP pulses 2/4, PT pulse 1/4. Sensation intact with SWMF Hyperkeratotic lesion noted fifth metatarsal base without any underlying ulceration drainage or signs of infection.  Minimal dried blood present.  Callus still present but seems to be somewhat better.  There is no surrounding erythema, ascending.  There is no drainage or pus.  No signs of infection.  Comes to the foot metatarsal base.   No pain with calf compression, swelling, warmth, erythema  Assessment: Pre-ulcerative callus right foot  Plan: -All treatment options discussed with the patient including all alternatives, risks, complications.  -Sharply debrided the hyperkeratotic lesion x 1 without any complications or bleeding. Continue moisturizer and offloading to help prevent or minimize reoccurrence.  Monitor for any skin breakdown. -Patient encouraged to call the office with any questions, concerns, change in symptoms.   Return in about 3 months (around 12/16/2023).  Vivi Barrack DPM

## 2023-11-07 ENCOUNTER — Encounter (HOSPITAL_BASED_OUTPATIENT_CLINIC_OR_DEPARTMENT_OTHER): Payer: Self-pay

## 2023-11-07 ENCOUNTER — Emergency Department (HOSPITAL_BASED_OUTPATIENT_CLINIC_OR_DEPARTMENT_OTHER): Payer: Medicare Other

## 2023-11-07 ENCOUNTER — Inpatient Hospital Stay (HOSPITAL_BASED_OUTPATIENT_CLINIC_OR_DEPARTMENT_OTHER)
Admission: EM | Admit: 2023-11-07 | Discharge: 2023-11-10 | DRG: 853 | Disposition: A | Discharge status: Home / Self Care | Payer: Medicare Other | Attending: Critical Care Medicine | Admitting: Critical Care Medicine

## 2023-11-07 ENCOUNTER — Other Ambulatory Visit: Payer: Self-pay

## 2023-11-07 DIAGNOSIS — N179 Acute kidney failure, unspecified: Secondary | ICD-10-CM | POA: Diagnosis present

## 2023-11-07 DIAGNOSIS — Z7982 Long term (current) use of aspirin: Secondary | ICD-10-CM

## 2023-11-07 DIAGNOSIS — I10 Essential (primary) hypertension: Secondary | ICD-10-CM | POA: Diagnosis present

## 2023-11-07 DIAGNOSIS — N200 Calculus of kidney: Secondary | ICD-10-CM

## 2023-11-07 DIAGNOSIS — A4151 Sepsis due to Escherichia coli [E. coli]: Principal | ICD-10-CM | POA: Diagnosis present

## 2023-11-07 DIAGNOSIS — R6521 Severe sepsis with septic shock: Secondary | ICD-10-CM | POA: Diagnosis present

## 2023-11-07 DIAGNOSIS — E210 Primary hyperparathyroidism: Secondary | ICD-10-CM | POA: Diagnosis present

## 2023-11-07 DIAGNOSIS — N139 Obstructive and reflux uropathy, unspecified: Secondary | ICD-10-CM | POA: Diagnosis present

## 2023-11-07 DIAGNOSIS — Z7983 Long term (current) use of bisphosphonates: Secondary | ICD-10-CM

## 2023-11-07 DIAGNOSIS — R001 Bradycardia, unspecified: Secondary | ICD-10-CM | POA: Diagnosis not present

## 2023-11-07 DIAGNOSIS — K219 Gastro-esophageal reflux disease without esophagitis: Secondary | ICD-10-CM | POA: Diagnosis present

## 2023-11-07 DIAGNOSIS — K5730 Diverticulosis of large intestine without perforation or abscess without bleeding: Secondary | ICD-10-CM | POA: Diagnosis present

## 2023-11-07 DIAGNOSIS — Z79899 Other long term (current) drug therapy: Secondary | ICD-10-CM

## 2023-11-07 DIAGNOSIS — E039 Hypothyroidism, unspecified: Secondary | ICD-10-CM | POA: Diagnosis present

## 2023-11-07 DIAGNOSIS — N3090 Cystitis, unspecified without hematuria: Secondary | ICD-10-CM

## 2023-11-07 DIAGNOSIS — Z806 Family history of leukemia: Secondary | ICD-10-CM

## 2023-11-07 DIAGNOSIS — L89151 Pressure ulcer of sacral region, stage 1: Secondary | ICD-10-CM | POA: Diagnosis present

## 2023-11-07 DIAGNOSIS — K8020 Calculus of gallbladder without cholecystitis without obstruction: Secondary | ICD-10-CM | POA: Diagnosis present

## 2023-11-07 DIAGNOSIS — A419 Sepsis, unspecified organism: Principal | ICD-10-CM | POA: Diagnosis present

## 2023-11-07 DIAGNOSIS — N136 Pyonephrosis: Secondary | ICD-10-CM | POA: Diagnosis present

## 2023-11-07 DIAGNOSIS — K439 Ventral hernia without obstruction or gangrene: Secondary | ICD-10-CM | POA: Diagnosis present

## 2023-11-07 DIAGNOSIS — Z8249 Family history of ischemic heart disease and other diseases of the circulatory system: Secondary | ICD-10-CM

## 2023-11-07 DIAGNOSIS — E1165 Type 2 diabetes mellitus with hyperglycemia: Secondary | ICD-10-CM | POA: Diagnosis present

## 2023-11-07 DIAGNOSIS — Z96651 Presence of right artificial knee joint: Secondary | ICD-10-CM | POA: Diagnosis present

## 2023-11-07 DIAGNOSIS — Z79890 Hormone replacement therapy (postmenopausal): Secondary | ICD-10-CM

## 2023-11-07 DIAGNOSIS — I9581 Postprocedural hypotension: Secondary | ICD-10-CM | POA: Diagnosis present

## 2023-11-07 DIAGNOSIS — Z7984 Long term (current) use of oral hypoglycemic drugs: Secondary | ICD-10-CM

## 2023-11-07 LAB — COMPREHENSIVE METABOLIC PANEL
ALT: 8 U/L (ref 0–44)
AST: 13 U/L — ABNORMAL LOW (ref 15–41)
Albumin: 4.4 g/dL (ref 3.5–5.0)
Alkaline Phosphatase: 46 U/L (ref 38–126)
Anion gap: 9 (ref 5–15)
BUN: 21 mg/dL (ref 8–23)
CO2: 29 mmol/L (ref 22–32)
Calcium: 10.5 mg/dL — ABNORMAL HIGH (ref 8.9–10.3)
Chloride: 104 mmol/L (ref 98–111)
Creatinine, Ser: 1.05 mg/dL — ABNORMAL HIGH (ref 0.44–1.00)
GFR, Estimated: 55 mL/min — ABNORMAL LOW (ref >60)
Glucose, Bld: 150 mg/dL — ABNORMAL HIGH (ref 70–99)
Potassium: 3.9 mmol/L (ref 3.5–5.1)
Sodium: 142 mmol/L (ref 135–145)
Total Bilirubin: 1 mg/dL (ref <1.2)
Total Protein: 7.1 g/dL (ref 6.5–8.1)

## 2023-11-07 LAB — LIPASE, BLOOD: Lipase: <10 U/L — ABNORMAL LOW (ref 11–51)

## 2023-11-07 LAB — URINALYSIS, ROUTINE W REFLEX MICROSCOPIC
Bilirubin Urine: NEGATIVE
Glucose, UA: NEGATIVE mg/dL
Ketones, ur: 40 mg/dL — AB
Nitrite: POSITIVE — AB
Protein, ur: 30 mg/dL — AB
Specific Gravity, Urine: 1.027 (ref 1.005–1.030)
WBC, UA: >50 WBC/hpf (ref 0–5)
pH: 6 (ref 5.0–8.0)

## 2023-11-07 LAB — CBC
HCT: 43.2 % (ref 36.0–46.0)
Hemoglobin: 14 g/dL (ref 12.0–15.0)
MCH: 30.4 pg (ref 26.0–34.0)
MCHC: 32.4 g/dL (ref 30.0–36.0)
MCV: 93.7 fL (ref 80.0–100.0)
Platelets: 223 10*3/uL (ref 150–400)
RBC: 4.61 MIL/uL (ref 3.87–5.11)
RDW: 13.6 % (ref 11.5–15.5)
WBC: 15.3 10*3/uL — ABNORMAL HIGH (ref 4.0–10.5)
nRBC: 0 % (ref 0.0–0.2)

## 2023-11-07 LAB — RESP PANEL BY RT-PCR (RSV, FLU A&B, COVID)  RVPGX2
Influenza A by PCR: NEGATIVE
Influenza B by PCR: NEGATIVE
Resp Syncytial Virus by PCR: NEGATIVE
SARS Coronavirus 2 by RT PCR: NEGATIVE

## 2023-11-07 LAB — LACTIC ACID, PLASMA: Lactic Acid, Venous: 1.8 mmol/L (ref 0.5–1.9)

## 2023-11-07 MED ORDER — ONDANSETRON HCL 4 MG/2ML IJ SOLN
4.0000 mg | Freq: Once | INTRAMUSCULAR | Status: DC | PRN
  Filled 2023-11-07: qty 2

## 2023-11-07 MED ORDER — ACETAMINOPHEN 325 MG PO TABS
650.0000 mg | ORAL_TABLET | Freq: Once | ORAL | Status: AC
  Administered 2023-11-07: 650 mg via ORAL
  Filled 2023-11-07: qty 2

## 2023-11-07 MED ORDER — SODIUM CHLORIDE 0.9 % IV SOLN
2.0000 g | INTRAVENOUS | Status: DC
  Administered 2023-11-07: 2 g via INTRAVENOUS
  Filled 2023-11-07: qty 20

## 2023-11-07 MED ORDER — IOHEXOL 300 MG/ML  SOLN
100.0000 mL | Freq: Once | INTRAMUSCULAR | Status: AC | PRN
  Administered 2023-11-07: 85 mL via INTRAVENOUS

## 2023-11-07 MED ORDER — ONDANSETRON 4 MG PO TBDP
4.0000 mg | ORAL_TABLET | Freq: Once | ORAL | Status: AC
  Administered 2023-11-07: 4 mg via ORAL
  Filled 2023-11-07: qty 1

## 2023-11-07 MED ORDER — ONDANSETRON HCL 4 MG/2ML IJ SOLN
4.0000 mg | Freq: Once | INTRAMUSCULAR | Status: AC
  Administered 2023-11-07: 4 mg via INTRAVENOUS
  Filled 2023-11-07: qty 2

## 2023-11-07 NOTE — ED Provider Notes (Signed)
[place] EMERGENCY DEPARTMENT AT [facility] Provider Note   CSN: [id number] Arrival date & time: 11/07/23  1752     History  Chief Complaint  Patient presents with   Emesis    Nicole [name] is a 77 y.o. female history of diabetes, GERD, hyperparathyroidism presented with right lower quadrant pain that began today with nausea without vomiting.  Patient denies fevers and is unsure of sick contacts.  Patient denies any dysuria or hematuria.  Patient states that she has right lower quadrant pain and is concerned that maybe her appendix that she still has this.  Patient denies any constipation or diarrhea, chest pain, shortness of breath, emesis.  Patient states she is persistently nauseous with dry heaving but has not been eating and so has not been vomiting.    Home Medications Prior to Admission medications   Medication Sig Start Date End Date Taking? Authorizing Provider  alendronate (FOSAMAX) 70 MG tablet Take by mouth. 08/19/20   [provider]  aspirin EC 81 MG tablet Take 81 mg by mouth at bedtime. Swallow whole.    [provider]  atenolol (TENORMIN) 25 MG tablet Take 25 mg by mouth daily.    [provider]  Calcium Carb-Cholecalciferol (CALCIUM 600+D3 PO) Take 1 tablet by mouth in the morning and at bedtime.    [provider]  Cholecalciferol (D3) 50 MCG (2000 UT) TABS  08/15/20   [provider]  furosemide (LASIX) 20 MG tablet Take 20 mg by mouth daily as needed (fluid retention/ankle swelling).  09/24/19   [provider]  levothyroxine (SYNTHROID) 50 MCG tablet Take by mouth. 08/19/20   [provider]  levothyroxine (SYNTHROID) 50 MCG tablet Take by mouth. 12/01/21   [provider]  lisinopril (PRINIVIL,ZESTRIL) 10 MG tablet Take 1 tablet (10 mg total) by mouth daily. 09/13/18   Dyann Kief, PA-C  lisinopril (ZESTRIL) 10 MG tablet Take 1 tablet by mouth daily. 12/02/21   [provider]  metFORMIN (GLUCOPHAGE-XR) 500 MG 24 hr tablet Take 1,000 mg by mouth daily.  08/01/18   [provider]  Polyethyl Glycol-Propyl Glycol (LUBRICANT EYE DROPS) 0.4-0.3 % SOLN Place 1 drop into both eyes at bedtime as needed (dry eyes.).    [provider]  spironolactone (ALDACTONE) 25 MG tablet Take 1 tablet (25 mg total) by mouth daily. 08/14/18 05/14/22  Quintella Reichert, MD      Allergies    Patient has no known allergies.    Review of Systems   Review of Systems  Gastrointestinal:  Positive for vomiting.    Physical Exam Updated Vital Signs BP 120/65   Pulse 76   Temp 99.9 F (37.7 C) (Oral)   Resp (!) 26   Ht 5\' 3"  (1.6 m)   Wt 67.6 kg   SpO2 98%   BMI 26.39 kg/m  Physical Exam Vitals reviewed.  Constitutional:      General: She is in acute distress.     Comments: Dry heaving  HENT:     Head: Normocephalic and atraumatic.  Eyes:     Extraocular Movements: Extraocular movements intact.     Conjunctiva/sclera: Conjunctivae normal.     Pupils: Pupils are equal, round, and reactive to light.  Cardiovascular:     Rate and Rhythm: Normal rate and regular rhythm.     Pulses: Normal pulses.     Heart sounds: Normal heart sounds.     Comments: 2+ bilateral radial/dorsalis pedis pulses  with regular rate Pulmonary:     Effort: Pulmonary effort is normal. No respiratory distress.     Breath sounds: Normal breath sounds.  Abdominal:     Palpations: Abdomen is soft.     Tenderness: There is no abdominal tenderness. There is no guarding or rebound.  Musculoskeletal:        General: Normal range of motion.     Cervical back: Normal range of motion and neck supple.     Comments: 5 out of 5 bilateral grip/leg extension strength  Skin:    General: Skin is warm and dry.     Capillary Refill: Capillary refill takes less than 2 seconds.  Neurological:     General: No focal deficit present.     Mental Status: She is alert and oriented to person,  place, and time.     Comments: Sensation intact in all 4 limbs  Psychiatric:        Mood and Affect: Mood normal.     ED Results / Procedures / Treatments   Labs (all labs ordered are listed, but only abnormal results are displayed) Labs Reviewed  CBC - Abnormal; Notable for the following components:      Result Value   WBC 15.3 (*)    All other components within normal limits  URINALYSIS, ROUTINE W REFLEX MICROSCOPIC - Abnormal; Notable for the following components:   APPearance HAZY (*)    Hgb urine dipstick SMALL (*)    Ketones, ur 40 (*)    Protein, ur 30 (*)    Nitrite POSITIVE (*)    Leukocytes,Ua LARGE (*)    Bacteria, UA MANY (*)    All other components within normal limits  COMPREHENSIVE METABOLIC PANEL - Abnormal; Notable for the following components:   Glucose, Bld 150 (*)    Creatinine, Ser 1.05 (*)    Calcium 10.5 (*)    AST 13 (*)    GFR, Estimated 55 (*)    All other components within normal limits  LIPASE, BLOOD - Abnormal; Notable for the following components:   Lipase <10 (*)    All other components within normal limits  RESP PANEL BY RT-PCR (RSV, FLU A&B, COVID)  RVPGX2  URINE CULTURE  CULTURE, BLOOD (ROUTINE X 2)  CULTURE, BLOOD (ROUTINE X 2)  LACTIC ACID, PLASMA  LACTIC ACID, PLASMA  PROTIME-INR  APTT    EKG None  Radiology CT ABDOMEN PELVIS W CONTRAST Result Date: 11/07/2023 CLINICAL DATA:  UTI, vomiting, leukocytosis EXAM: CT ABDOMEN AND PELVIS WITH CONTRAST TECHNIQUE: Multidetector CT imaging of the abdomen and pelvis was performed using the standard protocol following bolus administration of intravenous contrast. RADIATION DOSE REDUCTION: This exam was performed according to the departmental dose-optimization program which includes automated exposure control, adjustment of the mA and/or kV according to patient size and/or use of iterative reconstruction technique. CONTRAST:  85mL OMNIPAQUE IOHEXOL 300 MG/ML  SOLN COMPARISON:  CT pelvis  05/25/2019 FINDINGS: Lower chest: No acute abnormality. Hepatobiliary: Unremarkable liver. Cholelithiasis without evidence of acute cholecystitis. No biliary dilation. Pancreas: Unremarkable. Spleen: Unremarkable. Adrenals/Urinary Tract: Normal adrenal glands. Delayed right nephrogram. Nonobstructing stone in the right kidney. Mild right hydroureteronephrosis upstream from a 8 mm stone in the distal right ureter. No urinary calculi or hydronephrosis on the left. Bladder is nondistended. Stomach/Bowel: Normal caliber large and small bowel. Colonic diverticulosis without diverticulitis. No bowel wall thickening. The appendix is normal.Stomach is within normal limits. Vascular/Lymphatic: Aortic atherosclerosis. No enlarged abdominal or pelvic lymph nodes. Reproductive: Unremarkable.  Other: No free intraperitoneal fluid or air. Fat containing left ventral abdominal wall hernia. Musculoskeletal: No acute fracture. Remote left pubic rami fractures. IMPRESSION: 1. Mild right hydroureteronephrosis upstream from a 8 mm stone in the distal right ureter. 2. Cholelithiasis without evidence of acute cholecystitis. 3. Colonic diverticulosis without diverticulitis. 4. Fat containing left ventral abdominal wall hernia. Aortic Atherosclerosis (ICD10-I70.0). Electronically Signed   By: Minerva Fester M.D.   On: 11/07/2023 23:21   DG Chest Port 1 View Result Date: 11/07/2023 CLINICAL DATA:  Possible sepsis EXAM: PORTABLE CHEST 1 VIEW COMPARISON:  05/28/2020 FINDINGS: Chronic elevation of right diaphragm. No focal opacity or pleural effusion. Normal cardiac size. No pneumothorax IMPRESSION: No active disease. Chronic elevation of right diaphragm. Electronically Signed   By: Jasmine Pang M.D.   On: 11/07/2023 23:20    Procedures .Critical Care  Performed by: Netta Corrigan, PA-C Authorized by: Netta Corrigan, PA-C   Critical care provider statement:    Critical care time (minutes):  30   Critical care time was  exclusive of:  Separately billable procedures and treating other patients   Critical care was necessary to treat or prevent imminent or life-threatening deterioration of the following conditions:  Sepsis   Critical care was time spent personally by me on the following activities:  Blood draw for specimens, discussions with consultants, development of treatment plan with patient or surrogate, evaluation of patient's response to treatment, examination of patient, obtaining history from patient or surrogate, review of old charts, re-evaluation of patient's condition, pulse oximetry, ordering and review of radiographic studies, ordering and review of laboratory studies and ordering and performing treatments and interventions   I assumed direction of critical care for this patient from another provider in my specialty: no     Care discussed with: admitting provider       Medications Ordered in ED Medications  cefTRIAXone (ROCEPHIN) 2 g in sodium chloride 0.9 % 100 mL IVPB (2 g Intravenous New Bag/Given 11/07/23 2212)  ondansetron (ZOFRAN-ODT) disintegrating tablet 4 mg (4 mg Oral Given 11/07/23 1849)  iohexol (OMNIPAQUE) 300 MG/ML solution 100 mL (85 mLs Intravenous Contrast Given 11/07/23 2140)  ondansetron (ZOFRAN) injection 4 mg (4 mg Intravenous Given 11/07/23 2226)  acetaminophen (TYLENOL) tablet 650 mg (650 mg Oral Given 11/07/23 2356)    ED Course/ Medical Decision Making/ A&P                                 Medical Decision Making Amount and/or Complexity of Data Reviewed Labs: ordered. Radiology: ordered. ECG/medicine tests: ordered.  Risk Prescription drug management.   Nicole [name] 77 y.o. presented today for sepsis.  Working DDx that I considered at this time includes, but not limited to, sepsis, bacteremia, UTI, pneumonia, meningitis/encephalitis, cellulitis, ACS, myocarditis, acidosis, dehydration, electrolyte abnormalities.  R/o DDx: pneumonia,  meningitis/encephalitis, cellulitis, ACS, myocarditis, acidosis, dehydration, electrolyte abnormalities: These are considered less likely due to history of present illness, physical exam, labs/imaging findings.  Review of prior external notes: 09/15/2023 office visit  Unique Tests and My Interpretation: CBC: Leukocytosis 15.3 CMP: Unremarkable Lactic acid: 1.8 Lipase: Unremarkable UA: Nitrate positive, large leukocytes, many bacteria Chest x-ray: No acute findings aPTT: Pending PT/INR: Pending Blood cultures: Pending Respiratory panel: Negative EKG: Sinus 74 bpm, left anterior fascicular block, no ST elevations or depressions noted  Social Determinants of Health: none  Discussion with Independent Historian:  Family members  Discussion of Management  of Tests:  Annabell Howells, MD Urology  Risk: High: Hospitalization  Risk Stratification Score: None  Plan: On exam patient was septic on arrival.  On exam patient no acute distress with stable vitals.  Patient's exam was largely unremarkable she denies any abdominal tenderness however was endorsing right lower quadrant pain with dry heaving at home given her age will obtain labs along with imaging.  Labs do show a white count of 15.3 along with UTI and so given the fact she is endorsing nausea vomiting with this do suspect urosepsis probably from pyelonephritis and so we will start sepsis workup and give Rocephin with anticipation for admission.  Patient given Zofran again as she began to have dry heaving.  Fluids were withheld as patient does have an echo that shows moderate aortic regurg and his labs that do not show dehydration and nonelevated lactic acid.  Patient's labs and imaging came back showing the patient most likely has UTI with 8 mm stone in the distal right ureter.  Will consult urology as this is most likely septic stone though need stent.  Urology agrees that patient needs to be admitted and agrees that patient will need to go to  [facility].  They will see patient in the morning for stent placement but recommend that she remains n.p.o. after midnight.  Hospitalist will be consulted.  Patient signed out to [facility], MD.  Please review their note for the continuation of patient's care.  The plan at this point is speak with the hospitalist for admission.  Patient stable at this time for admission.  This chart was dictated using voice recognition software.  Despite best efforts to proofread,  errors can occur which can change the documentation meaning.         Final Clinical Impression(s) / ED Diagnoses Final diagnoses:  Sepsis, due to unspecified organism, unspecified whether acute organ dysfunction present [facility])  Cystitis  Nephrolithiasis    Rx / DC Orders ED Discharge Orders     None         Remi Deter 11/07/23 2359    Benjiman Core, MD 11/08/23 2318

## 2023-11-07 NOTE — Sepsis Progress Note (Signed)
[facility] following for sepsis protocol. 

## 2023-11-07 NOTE — ED Notes (Signed)
PT called was in restroom

## 2023-11-07 NOTE — ED Triage Notes (Signed)
In for eval of RLQ abd pain with nausea and vomiting. Onset 2300 last pm. Reports soft stool but denies diarrhea.

## 2023-11-07 NOTE — ED Notes (Signed)
3 RN's attempted IV sticks w/o success. CT tech placed a 22 in left outer wrist for Ct w/ contrast. Unable to draw blood cultures, so abx begun. PA is aware.

## 2023-11-08 ENCOUNTER — Emergency Department (HOSPITAL_COMMUNITY): Payer: Medicare Other

## 2023-11-08 ENCOUNTER — Encounter (HOSPITAL_COMMUNITY): Payer: Self-pay | Admitting: Internal Medicine

## 2023-11-08 ENCOUNTER — Emergency Department (HOSPITAL_COMMUNITY): Payer: Medicare Other | Admitting: Anesthesiology

## 2023-11-08 ENCOUNTER — Encounter (HOSPITAL_COMMUNITY)
Admission: EM | Disposition: A | Discharge status: Home / Self Care | Payer: Self-pay | Source: Home / Self Care | Attending: Critical Care Medicine

## 2023-11-08 DIAGNOSIS — I10 Essential (primary) hypertension: Secondary | ICD-10-CM | POA: Diagnosis present

## 2023-11-08 DIAGNOSIS — A419 Sepsis, unspecified organism: Secondary | ICD-10-CM | POA: Diagnosis present

## 2023-11-08 DIAGNOSIS — A4151 Sepsis due to Escherichia coli [E. coli]: Secondary | ICD-10-CM | POA: Diagnosis present

## 2023-11-08 DIAGNOSIS — Z806 Family history of leukemia: Secondary | ICD-10-CM | POA: Diagnosis not present

## 2023-11-08 DIAGNOSIS — E210 Primary hyperparathyroidism: Secondary | ICD-10-CM | POA: Diagnosis present

## 2023-11-08 DIAGNOSIS — N200 Calculus of kidney: Secondary | ICD-10-CM | POA: Diagnosis present

## 2023-11-08 DIAGNOSIS — K5730 Diverticulosis of large intestine without perforation or abscess without bleeding: Secondary | ICD-10-CM | POA: Diagnosis present

## 2023-11-08 DIAGNOSIS — L89151 Pressure ulcer of sacral region, stage 1: Secondary | ICD-10-CM | POA: Diagnosis present

## 2023-11-08 DIAGNOSIS — Z79899 Other long term (current) drug therapy: Secondary | ICD-10-CM | POA: Diagnosis not present

## 2023-11-08 DIAGNOSIS — Z7983 Long term (current) use of bisphosphonates: Secondary | ICD-10-CM | POA: Diagnosis not present

## 2023-11-08 DIAGNOSIS — N139 Obstructive and reflux uropathy, unspecified: Secondary | ICD-10-CM | POA: Diagnosis present

## 2023-11-08 DIAGNOSIS — Z79890 Hormone replacement therapy (postmenopausal): Secondary | ICD-10-CM | POA: Diagnosis not present

## 2023-11-08 DIAGNOSIS — Z8249 Family history of ischemic heart disease and other diseases of the circulatory system: Secondary | ICD-10-CM | POA: Diagnosis not present

## 2023-11-08 DIAGNOSIS — E039 Hypothyroidism, unspecified: Secondary | ICD-10-CM | POA: Diagnosis present

## 2023-11-08 DIAGNOSIS — Z96651 Presence of right artificial knee joint: Secondary | ICD-10-CM | POA: Diagnosis present

## 2023-11-08 DIAGNOSIS — K219 Gastro-esophageal reflux disease without esophagitis: Secondary | ICD-10-CM | POA: Diagnosis present

## 2023-11-08 DIAGNOSIS — N390 Urinary tract infection, site not specified: Secondary | ICD-10-CM | POA: Diagnosis not present

## 2023-11-08 DIAGNOSIS — E1165 Type 2 diabetes mellitus with hyperglycemia: Secondary | ICD-10-CM | POA: Diagnosis present

## 2023-11-08 DIAGNOSIS — K439 Ventral hernia without obstruction or gangrene: Secondary | ICD-10-CM | POA: Diagnosis present

## 2023-11-08 DIAGNOSIS — Z7984 Long term (current) use of oral hypoglycemic drugs: Secondary | ICD-10-CM | POA: Diagnosis not present

## 2023-11-08 DIAGNOSIS — I9581 Postprocedural hypotension: Secondary | ICD-10-CM | POA: Diagnosis present

## 2023-11-08 DIAGNOSIS — N179 Acute kidney failure, unspecified: Secondary | ICD-10-CM | POA: Diagnosis present

## 2023-11-08 DIAGNOSIS — N136 Pyonephrosis: Secondary | ICD-10-CM | POA: Diagnosis present

## 2023-11-08 DIAGNOSIS — R6521 Severe sepsis with septic shock: Secondary | ICD-10-CM | POA: Diagnosis present

## 2023-11-08 DIAGNOSIS — Z7982 Long term (current) use of aspirin: Secondary | ICD-10-CM | POA: Diagnosis not present

## 2023-11-08 DIAGNOSIS — R001 Bradycardia, unspecified: Secondary | ICD-10-CM | POA: Diagnosis not present

## 2023-11-08 DIAGNOSIS — K8020 Calculus of gallbladder without cholecystitis without obstruction: Secondary | ICD-10-CM | POA: Diagnosis present

## 2023-11-08 HISTORY — PX: CYSTOSCOPY W/ URETERAL STENT PLACEMENT: SHX1429

## 2023-11-08 LAB — COMPREHENSIVE METABOLIC PANEL
ALT: 12 U/L (ref 0–44)
AST: 17 U/L (ref 15–41)
Albumin: 3.3 g/dL — ABNORMAL LOW (ref 3.5–5.0)
Alkaline Phosphatase: 40 U/L (ref 38–126)
Anion gap: 9 (ref 5–15)
BUN: 18 mg/dL (ref 8–23)
CO2: 22 mmol/L (ref 22–32)
Calcium: 8.9 mg/dL (ref 8.9–10.3)
Chloride: 108 mmol/L (ref 98–111)
Creatinine, Ser: 1.15 mg/dL — ABNORMAL HIGH (ref 0.44–1.00)
GFR, Estimated: 49 mL/min — ABNORMAL LOW (ref >60)
Glucose, Bld: 197 mg/dL — ABNORMAL HIGH (ref 70–99)
Potassium: 3.8 mmol/L (ref 3.5–5.1)
Sodium: 139 mmol/L (ref 135–145)
Total Bilirubin: 0.9 mg/dL (ref <1.2)
Total Protein: 6 g/dL — ABNORMAL LOW (ref 6.5–8.1)

## 2023-11-08 LAB — GLUCOSE, CAPILLARY
Glucose-Capillary: 142 mg/dL — ABNORMAL HIGH (ref 70–99)
Glucose-Capillary: 187 mg/dL — ABNORMAL HIGH (ref 70–99)
Glucose-Capillary: 173 mg/dL — ABNORMAL HIGH (ref 70–99)
Glucose-Capillary: 133 mg/dL — ABNORMAL HIGH (ref 70–99)
Glucose-Capillary: 137 mg/dL — ABNORMAL HIGH (ref 70–99)

## 2023-11-08 LAB — MRSA NEXT GEN BY PCR, NASAL: MRSA by PCR Next Gen: NOT DETECTED

## 2023-11-08 LAB — CBC
HCT: 36.5 % (ref 36.0–46.0)
Hemoglobin: 11.7 g/dL — ABNORMAL LOW (ref 12.0–15.0)
MCH: 30.3 pg (ref 26.0–34.0)
MCHC: 32.1 g/dL (ref 30.0–36.0)
MCV: 94.6 fL (ref 80.0–100.0)
Platelets: 203 10*3/uL (ref 150–400)
RBC: 3.86 MIL/uL — ABNORMAL LOW (ref 3.87–5.11)
RDW: 13.8 % (ref 11.5–15.5)
WBC: 18.8 10*3/uL — ABNORMAL HIGH (ref 4.0–10.5)
nRBC: 0 % (ref 0.0–0.2)

## 2023-11-08 LAB — LACTIC ACID, PLASMA: Lactic Acid, Venous: 1.7 mmol/L (ref 0.5–1.9)

## 2023-11-08 SURGERY — CYSTOSCOPY, WITH RETROGRADE PYELOGRAM AND URETERAL STENT INSERTION
Anesthesia: General | Site: Ureter | Laterality: Right

## 2023-11-08 MED ORDER — PHENYLEPHRINE HCL (PRESSORS) 10 MG/ML IV SOLN
INTRAVENOUS | Status: DC | PRN
  Administered 2023-11-08: 80 ug via INTRAVENOUS

## 2023-11-08 MED ORDER — PHENYLEPHRINE HCL-NACL 20-0.9 MG/250ML-% IV SOLN
INTRAVENOUS | Status: DC | PRN
  Administered 2023-11-08: 50 ug/min via INTRAVENOUS

## 2023-11-08 MED ORDER — POLYETHYLENE GLYCOL 3350 17 G PO PACK
17.0000 g | PACK | Freq: Every day | ORAL | Status: DC | PRN
  Administered 2023-11-10: 17 g via ORAL
  Filled 2023-11-08: qty 1

## 2023-11-08 MED ORDER — SUCCINYLCHOLINE CHLORIDE 20 MG/ML IJ SOLN
INTRAMUSCULAR | Status: DC | PRN
  Administered 2023-11-08: 120 mg via INTRAVENOUS

## 2023-11-08 MED ORDER — DOCUSATE SODIUM 100 MG PO CAPS
100.0000 mg | ORAL_CAPSULE | Freq: Two times a day (BID) | ORAL | Status: DC | PRN
  Administered 2023-11-10: 100 mg via ORAL
  Filled 2023-11-08: qty 1

## 2023-11-08 MED ORDER — ENOXAPARIN SODIUM 40 MG/0.4ML IJ SOSY
40.0000 mg | PREFILLED_SYRINGE | INTRAMUSCULAR | Status: DC
Start: 2023-11-08 — End: 2023-11-10
  Administered 2023-11-08 – 2023-11-10 (×3): 40 mg via SUBCUTANEOUS
  Filled 2023-11-08 (×3): qty 0.4

## 2023-11-08 MED ORDER — FENTANYL CITRATE (PF) 100 MCG/2ML IJ SOLN
INTRAMUSCULAR | Status: AC
  Filled 2023-11-08: qty 2

## 2023-11-08 MED ORDER — INSULIN ASPART 100 UNIT/ML IJ SOLN
0.0000 [IU] | Freq: Three times a day (TID) | INTRAMUSCULAR | Status: DC
  Administered 2023-11-08: 2 [IU] via SUBCUTANEOUS
  Administered 2023-11-08: 1 [IU] via SUBCUTANEOUS
  Administered 2023-11-08: 2 [IU] via SUBCUTANEOUS

## 2023-11-08 MED ORDER — CHLORHEXIDINE GLUCONATE CLOTH 2 % EX PADS
6.0000 | MEDICATED_PAD | Freq: Every day | CUTANEOUS | Status: DC
  Administered 2023-11-08 – 2023-11-10 (×3): 6 via TOPICAL

## 2023-11-08 MED ORDER — FENTANYL CITRATE (PF) 250 MCG/5ML IJ SOLN
INTRAMUSCULAR | Status: AC
  Filled 2023-11-08: qty 5

## 2023-11-08 MED ORDER — IOHEXOL 300 MG/ML  SOLN
INTRAMUSCULAR | Status: DC | PRN
  Administered 2023-11-08: 2 mL via URETHRAL

## 2023-11-08 MED ORDER — PANTOPRAZOLE SODIUM 40 MG PO TBEC
40.0000 mg | DELAYED_RELEASE_TABLET | Freq: Every day | ORAL | Status: DC
  Administered 2023-11-08 – 2023-11-10 (×3): 40 mg via ORAL
  Filled 2023-11-08 (×3): qty 1

## 2023-11-08 MED ORDER — PROPOFOL 10 MG/ML IV BOLUS
INTRAVENOUS | Status: DC | PRN
  Administered 2023-11-08: 100 mg via INTRAVENOUS

## 2023-11-08 MED ORDER — 0.9 % SODIUM CHLORIDE (POUR BTL) OPTIME
TOPICAL | Status: DC | PRN
  Administered 2023-11-08: 1000 mL

## 2023-11-08 MED ORDER — OXYCODONE HCL 5 MG PO TABS: 5.0000 mg | ORAL_TABLET | Freq: Once | ORAL | Status: DC | PRN

## 2023-11-08 MED ORDER — DEXAMETHASONE SODIUM PHOSPHATE 4 MG/ML IJ SOLN
INTRAMUSCULAR | Status: DC | PRN
  Administered 2023-11-08: 10 mg via INTRAVENOUS

## 2023-11-08 MED ORDER — NOREPINEPHRINE 4 MG/250ML-% IV SOLN
1.0000 ug/min | INTRAVENOUS | Status: DC
  Administered 2023-11-08: 1 ug/min via INTRAVENOUS
  Filled 2023-11-08: qty 250

## 2023-11-08 MED ORDER — SODIUM CHLORIDE 0.9 % IV BOLUS
1000.0000 mL | Freq: Once | INTRAVENOUS | Status: AC
  Administered 2023-11-08: 1000 mL via INTRAVENOUS

## 2023-11-08 MED ORDER — SODIUM CHLORIDE 0.9 % IV SOLN
2.0000 g | INTRAVENOUS | Status: DC
  Administered 2023-11-08 – 2023-11-09 (×2): 2 g via INTRAVENOUS
  Filled 2023-11-08 (×2): qty 20

## 2023-11-08 MED ORDER — FENTANYL CITRATE (PF) 100 MCG/2ML IJ SOLN
INTRAMUSCULAR | Status: DC | PRN
  Administered 2023-11-08 (×2): 25 ug via INTRAVENOUS

## 2023-11-08 MED ORDER — ALBUMIN HUMAN 5 % IV SOLN
INTRAVENOUS | Status: AC
  Filled 2023-11-08: qty 250

## 2023-11-08 MED ORDER — ORAL CARE MOUTH RINSE: 15.0000 mL | OROMUCOSAL | Status: DC | PRN

## 2023-11-08 MED ORDER — NOREPINEPHRINE 4 MG/250ML-% IV SOLN
2.0000 ug/min | INTRAVENOUS | Status: DC
  Administered 2023-11-09 (×2): 2 ug/min via INTRAVENOUS
  Filled 2023-11-08: qty 250

## 2023-11-08 MED ORDER — INSULIN ASPART 100 UNIT/ML IJ SOLN: 0.0000 [IU] | Freq: Every day | INTRAMUSCULAR | Status: DC

## 2023-11-08 MED ORDER — LEVOTHYROXINE SODIUM 50 MCG PO TABS
50.0000 ug | ORAL_TABLET | Freq: Every day | ORAL | Status: DC
  Administered 2023-11-09 – 2023-11-10 (×2): 50 ug via ORAL
  Filled 2023-11-08 (×2): qty 1

## 2023-11-08 MED ORDER — SODIUM CHLORIDE 0.9 % IV SOLN: INTRAVENOUS | Status: DC | PRN

## 2023-11-08 MED ORDER — LACTATED RINGERS IV SOLN
INTRAVENOUS | Status: AC
  Administered 2023-11-08: 50 mL/h via INTRAVENOUS

## 2023-11-08 MED ORDER — SODIUM CHLORIDE 0.9 % IV SOLN: 250.0000 mL | INTRAVENOUS | Status: AC

## 2023-11-08 MED ORDER — FENTANYL CITRATE (PF) 100 MCG/2ML IJ SOLN
25.0000 ug | INTRAMUSCULAR | Status: DC | PRN
  Administered 2023-11-08: 25 ug via INTRAVENOUS

## 2023-11-08 MED ORDER — LIDOCAINE HCL (CARDIAC) PF 50 MG/5ML IV SOSY
PREFILLED_SYRINGE | INTRAVENOUS | Status: DC | PRN
  Administered 2023-11-08: 20 mg via INTRAVENOUS

## 2023-11-08 MED ORDER — EPHEDRINE SULFATE (PRESSORS) 50 MG/ML IJ SOLN
INTRAMUSCULAR | Status: DC | PRN
  Administered 2023-11-08: 5 mg via INTRAVENOUS

## 2023-11-08 MED ORDER — MIDAZOLAM HCL 2 MG/2ML IJ SOLN
0.5000 mg | Freq: Once | INTRAMUSCULAR | Status: DC | PRN
Start: 2023-11-08 — End: 2023-11-08

## 2023-11-08 MED ORDER — WATER FOR IRRIGATION, STERILE IR SOLN
Status: DC | PRN
  Administered 2023-11-08: 3000 mL via INTRAVESICAL

## 2023-11-08 MED ORDER — OXYCODONE HCL 5 MG/5ML PO SOLN: 5.0000 mg | Freq: Once | ORAL | Status: DC | PRN

## 2023-11-08 MED ORDER — ALBUMIN HUMAN 5 % IV SOLN
12.5000 g | Freq: Once | INTRAVENOUS | Status: AC
  Administered 2023-11-08: 12.5 g via INTRAVENOUS

## 2023-11-08 SURGICAL SUPPLY — 23 items
BAG URINE DRAIN 2000ML AR STRL (UROLOGICAL SUPPLIES) ×1 IMPLANT
BAG URO CATCHER STRL LF (MISCELLANEOUS) ×1 IMPLANT
CATH FOLEY 2WAY SLVR 5CC 16FR (CATHETERS) IMPLANT
CATH URETL OPEN END 6FR 70 (CATHETERS) ×1 IMPLANT
GLOVE BIOGEL M 8.0 STRL (GLOVE) ×1 IMPLANT
GLOVE BIOGEL PI IND STRL 7.0 (GLOVE) IMPLANT
GLOVE BIOGEL PI IND STRL 8 (GLOVE) IMPLANT
GLOVE SURG SS PI 7.0 STRL IVOR (GLOVE) IMPLANT
GOWN STRL REUS W/ TWL LRG LVL3 (GOWN DISPOSABLE) ×1 IMPLANT
GOWN STRL REUS W/ TWL XL LVL3 (GOWN DISPOSABLE) ×1 IMPLANT
GUIDEWIRE ANG ZIPWIRE 038X150 (WIRE) IMPLANT
GUIDEWIRE STR DUAL SENSOR (WIRE) ×1 IMPLANT
KIT TURNOVER KIT B (KITS) ×1 IMPLANT
MANIFOLD NEPTUNE II (INSTRUMENTS) IMPLANT
NS IRRIG 1000ML POUR BTL (IV SOLUTION) IMPLANT
PACK CYSTO (CUSTOM PROCEDURE TRAY) ×1 IMPLANT
STENT URET 6FRX24 CONTOUR (STENTS) IMPLANT
STENT URET 6FRX26 CONTOUR (STENTS) IMPLANT
SYPHON OMNI JUG (MISCELLANEOUS) ×1 IMPLANT
SYR CONTROL 10ML LL (SYRINGE) IMPLANT
TOWEL GREEN STERILE FF (TOWEL DISPOSABLE) ×1 IMPLANT
TUBE CONNECTING 12X1/4 (SUCTIONS) IMPLANT
WATER STERILE IRR 3000ML UROMA (IV SOLUTION) ×1 IMPLANT

## 2023-11-08 NOTE — Progress Notes (Signed)
Patient ID: Nicole [name], female   DOB: [birth year], 77 y.o.   MRN: [id number] * Day of Surgery *  Subjective: Nicole [name] had hypotension post operatively and is now on low dose pressors but is feeling well with improved BP and resolution of her pain.   She is to be admitted to the ICU.  ROS:  Review of Systems  All other systems reviewed and are negative.   Anti-infectives: Anti-infectives (From admission, onward)    Start     Dose/Rate Route Frequency Ordered Stop   11/07/23 2145  cefTRIAXone (ROCEPHIN) 2 g in sodium chloride 0.9 % 100 mL IVPB        2 g 200 mL/hr over 30 Minutes Intravenous Every 24 hours 11/07/23 2141 11/14/23 2159       Current Facility-Administered Medications  Medication Dose Route Frequency Provider Last Rate Last Admin   albumin human 5 % solution            cefTRIAXone (ROCEPHIN) 2 g in sodium chloride 0.9 % 100 mL IVPB  2 g Intravenous Q24H Evlyn Kanner T, PA-C 200 mL/hr at 11/07/23 2212 2 g at 11/07/23 2212   docusate sodium (COLACE) capsule 100 mg  100 mg Oral BID PRN Lorin Glass, MD       fentaNYL (SUBLIMAZE) 100 MCG/2ML injection            insulin aspart (novoLOG) injection 0-5 Units  0-5 Units Subcutaneous QHS Murna Backer Giovanni, MD       insulin aspart (novoLOG) injection 0-9 Units  0-9 Units Subcutaneous TID WC Santiel Topper Giovanni, MD       polyethylene glycol (MIRALAX / GLYCOLAX) packet 17 g  17 g Oral Daily PRN Lorin Glass, MD         Objective: Vital signs in last 24 hours: Temp:  [98.5 F (36.9 C)-99.9 F (37.7 C)] 99.9 F (37.7 C) (12/16 2239) Pulse Rate:  [57-80] 57 (12/17 0730) Resp:  [16-26] 25 (12/17 0730) BP: (68-139)/(38-102) 107/53 (12/17 0730) SpO2:  [82 %-100 %] 95 % (12/17 0730) Weight:  [67.6 kg] 67.6 kg (12/16 1831)  Intake/Output from previous day: 12/16 0701 - 12/17 0700 In: 600 [I.V.:600] Out: 1 [Blood:1] Intake/Output this shift: No intake/output data recorded.   Physical Exam Vitals reviewed.   Constitutional:      Appearance: Normal appearance.  Genitourinary:    Comments: Urine slightly bloody in foley tube.  Neurological:     Mental Status: She is alert.     Lab Results:  Recent Labs    11/07/23 1845  WBC 15.3*  HGB 14.0  HCT 43.2  PLT 223   BMET Recent Labs    11/07/23 2029  NA 142  K 3.9  CL 104  CO2 29  GLUCOSE 150*  BUN 21  CREATININE 1.05*  CALCIUM 10.5*   PT/INR No results for input(s): "LABPROT", "INR" in the last 72 hours. ABG No results for input(s): "PHART", "HCO3" in the last 72 hours.  Invalid input(s): "PCO2", "PO2"  Studies/Results: DG C-Arm 1-60 Min-No Report Result Date: 11/08/2023 Fluoroscopy was utilized by the requesting physician.  No radiographic interpretation.   CT ABDOMEN PELVIS W CONTRAST Result Date: 11/07/2023 CLINICAL DATA:  UTI, vomiting, leukocytosis EXAM: CT ABDOMEN AND PELVIS WITH CONTRAST TECHNIQUE: Multidetector CT imaging of the abdomen and pelvis was performed using the standard protocol following bolus administration of intravenous contrast. RADIATION DOSE REDUCTION: This exam was performed according to the departmental dose-optimization program which includes automated exposure control, adjustment  of the mA and/or kV according to patient size and/or use of iterative reconstruction technique. CONTRAST:  85mL OMNIPAQUE IOHEXOL 300 MG/ML  SOLN COMPARISON:  CT pelvis 05/25/2019 FINDINGS: Lower chest: No acute abnormality. Hepatobiliary: Unremarkable liver. Cholelithiasis without evidence of acute cholecystitis. No biliary dilation. Pancreas: Unremarkable. Spleen: Unremarkable. Adrenals/Urinary Tract: Normal adrenal glands. Delayed right nephrogram. Nonobstructing stone in the right kidney. Mild right hydroureteronephrosis upstream from a 8 mm stone in the distal right ureter. No urinary calculi or hydronephrosis on the left. Bladder is nondistended. Stomach/Bowel: Normal caliber large and small bowel. Colonic diverticulosis  without diverticulitis. No bowel wall thickening. The appendix is normal.Stomach is within normal limits. Vascular/Lymphatic: Aortic atherosclerosis. No enlarged abdominal or pelvic lymph nodes. Reproductive: Unremarkable. Other: No free intraperitoneal fluid or air. Fat containing left ventral abdominal wall hernia. Musculoskeletal: No acute fracture. Remote left pubic rami fractures. IMPRESSION: 1. Mild right hydroureteronephrosis upstream from a 8 mm stone in the distal right ureter. 2. Cholelithiasis without evidence of acute cholecystitis. 3. Colonic diverticulosis without diverticulitis. 4. Fat containing left ventral abdominal wall hernia. Aortic Atherosclerosis (ICD10-I70.0). Electronically Signed   By: Minerva Fester M.D.   On: 11/07/2023 23:21   DG Chest Port 1 View Result Date: 11/07/2023 CLINICAL DATA:  Possible sepsis EXAM: PORTABLE CHEST 1 VIEW COMPARISON:  05/28/2020 FINDINGS: Chronic elevation of right diaphragm. No focal opacity or pleural effusion. Normal cardiac size. No pneumothorax IMPRESSION: No active disease. Chronic elevation of right diaphragm. Electronically Signed   By: Jasmine Pang M.D.   On: 11/07/2023 23:20     Assessment and Plan: Right distal ureteral stone with sepsis.   Stent and foley in place.  Post op hypotension improving.   I will get her scheduled for ureteroscopy in 1-3 weeks.       LOS: 0 days    Bjorn Pippin 11/08/2023

## 2023-11-08 NOTE — Anesthesia Preprocedure Evaluation (Addendum)
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 on medications (-) angina  Rhythm:Regular Rate:Normal  '19 ECHO:  - Left ventricle: The cavity size was normal. There was moderate    focal basal and mild concentric hypertrophy of the septum.    Systolic function was normal.  -EF 55% to 60%. Although no diagnostic regional wall    motion abnormality was identified, this possibility cannot be completely excluded on the basis of this study due to foreshortening of images in several views. grade 1 diastolic dysfunction).  - Aortic valve: mild-mod AI.  - Aorta: There was severe, localized, fixed, protruding atheromatous plaque in the proximal aortic arch.  - Mitral valve: trivial MR.  - Right ventricle: Systolic function was normal.  - Tricuspid valve: <ild TR.  - Pulmonic valve: There was no significant regurgitation.  - Pulmonary arteries: Systolic pressure was mildly increased. PA peak pressure: 34 mm Hg (S).     Neuro/Psych negative neurological ROS     GI/Hepatic Neg liver ROS,GERD  Controlled and Medicated,,N/v with kidney stone   Endo/Other  diabetes (no longer on meds, glu 150)Hypothyroidism    Renal/GU stone     Musculoskeletal  (+) Arthritis ,    Abdominal   Peds  Hematology   Anesthesia Other Findings   Reproductive/Obstetrics                             Anesthesia Physical Anesthesia  Plan  ASA: 2 and emergent  Anesthesia Plan: General   Post-op Pain Management: Ofirmev IV (intra-op)*   Induction: Intravenous  PONV Risk Score and Plan: 3 and Ondansetron, Dexamethasone and Treatment may vary due to age or medical condition  Airway Management Planned: Oral ETT  Additional Equipment: None  Intra-op Plan:   Post-operative Plan: Extubation in OR  Informed Consent: I have reviewed the patients History and Physical, chart, labs and discussed the procedure including the risks, benefits and alternatives for the proposed anesthesia with the patient or authorized representative who has indicated his/her understanding and acceptance.     Dental advisory given  Plan Discussed with: CRNA and Surgeon  Anesthesia Plan Comments:         Anesthesia Quick Evaluation

## 2023-11-08 NOTE — H&P (Signed)
NAME:  Nicole [name], MRN:  [id number], DOB:  March 05, 1946, LOS: 0 ADMISSION DATE:  11/07/2023 CONSULTATION DATE:  11/08/2023 REFERRING MD:  Janalyn Shy - [facility] CHIEF COMPLAINT:  Hypotension, RLQ pain  History of Present Illness:  77 year old woman who presented to [facility] 12/16 as a transfer from [facility] for RLQ abdominal pain, nausea without vomiting. PMHx significant for HTN, T2DM, GERD, hyperparathyroidism, OA (R knee).   Patient initially presented to [facility] with RLQ abdominal pain and nausea. Denied fever/chills, CP/SOB, vomiting or diarrhea. Denies dysuria, no known sick contacts. Poor PO intake due to nausea. Labs were notable for WBC 15.3, Hgb 14, Plt 223. Na 142, K 3.9, CO2 29, Cr 1.05 (baseline 0.6), Ca 10.5. LFTs WNL. LA 1.8. CXR unremarkable with the exception of chronic elevation of R diaphragm. CT A/P with mild R hydroureteronephrosis upstream from a 8mm stone in the distal R ureter, cholelithiasis without evidence of cholecystitis, colonic diverticulosis, L ventral abdominal wall hernia. Urology was consulted with recommendation for transfer to [facility] for emergent cystoscopy and R ureteral stenting.  Taken to OR 12/17AM for cystoscopy and insertion of R double J ureteral stent. Intraoperative course was uncomplicated. Patient was transferred to PACU postoperatively where she unfortunately remained hypotensive.  PCCM consulted for ICU admission in the setting of hypotension and vasopressor initiation.  Pertinent Medical History:   Past Medical History:  Diagnosis Date   Diabetes mellitus    dx 5 - 6 yrs ago.   Hypertension    Primary localized osteoarthritis of right knee 03/08/2018   Significant Hospital Events: Including procedures, antibiotic start and stop dates in addition to other pertinent events   12/16 - Presented to [facility] with abdominal pain, nausea. CT +R hydroureteronephrosis, 8mm stone. Uro consulted. 12/17 - Transferred to [facility] for emergent cystoscopy and stent placement. Hypotensive  postop requiring low-dose Levo. PCCM consulted.  Interim History / Subjective:  PCCM consulted for hypotension, pressors, ICU admission.  Objective:  Blood pressure (!) 98/49, pulse 64, temperature 99.9 F (37.7 C), temperature source Oral, resp. rate 20, height 5\' 3"  (1.6 m), weight 67.6 kg, SpO2 93%.        Intake/Output Summary (Last 24 hours) at 11/08/2023 9629 Last data filed at 11/08/2023 0357 Gross per 24 hour  Intake 600 ml  Output 1 ml  Net 599 ml   Filed Weights   11/07/23 1831  Weight: 67.6 kg   Physical Examination: General: Acutely ill-appearing elderly woman in NAD. HEENT: [place]/AT, anicteric sclera, PERRL, dry mucous membranes. Neuro: Awake, slightly drowsy but wakes easily to voice. Oriented x 4. Responds to verbal stimuli. Following commands consistently. Moves all 4 extremities spontaneously. Mild generalized weakness.  CV: RRR, no m/g/r. PULM: Breathing even and unlabored on RA. Lung fields CTAB. GI: Soft, mild generalized TTP (now improved), nondistended. Normoactive bowel sounds. Extremities: No  LE edema noted. Skin: Warm/dry, no rashes.  Resolved Hospital Problem List:    Assessment & Plan:  Undifferentiated shock, presume hypovolemia s/p OR versus septic - Goal MAP > 65 - Fluid resuscitation as tolerated - Peripheral Levophed titrated to goal MAP - Trend WBC, fever curve, LA - F/u Cx data - Continue broad-spectrum antibiotics (ceftriaxone)  Obstructive uropathy s/p ureteral stent placement 12/17 - Underwent double J stent placement 12/17 (Dr. Annabell Howells) - Urology following, appreciate recs - Continue antibiotics - Outpatient f/u for stent after discharge  HTN - SSI - CBGs ACHS - Goal CBG 140-180  Hyperparathyroidism - Trend Ca  Best Practice: (right click and "Reselect all  SmartList Selections" daily)   Diet/type: Regular consistency (see orders) DVT prophylaxis: SCDs GI prophylaxis: N/A Lines: N/A Foley:  Yes, and it is still  needed Code Status:  full code Last date of multidisciplinary goals of care discussion [Pending]  Labs:  CBC: Recent Labs  Lab 11/07/23 1845  WBC 15.3*  HGB 14.0  HCT 43.2  MCV 93.7  PLT 223   Basic Metabolic Panel: Recent Labs  Lab 11/07/23 2029  NA 142  K 3.9  CL 104  CO2 29  GLUCOSE 150*  BUN 21  CREATININE 1.05*  CALCIUM 10.5*   GFR: Estimated Creatinine Clearance: 41.4 mL/min (A) (by C-G formula based on SCr of 1.05 mg/dL (H)). Recent Labs  Lab 11/07/23 1845 11/07/23 2202  WBC 15.3*  --   LATICACIDVEN  --  1.8   Liver Function Tests: Recent Labs  Lab 11/07/23 2029  AST 13*  ALT 8  ALKPHOS 46  BILITOT 1.0  PROT 7.1  ALBUMIN 4.4   Recent Labs  Lab 11/07/23 2029  LIPASE <10*   No results for input(s): "AMMONIA" in the last 168 hours.  ABG: No results found for: "PHART", "PCO2ART", "PO2ART", "HCO3", "TCO2", "ACIDBASEDEF", "O2SAT"   Coagulation Profile: No results for input(s): "INR", "PROTIME" in the last 168 hours.  Cardiac Enzymes: No results for input(s): "CKTOTAL", "CKMB", "CKMBINDEX", "TROPONINI" in the last 168 hours.  HbA1C: Hgb A1c MFr Bld  Date/Time Value Ref Range Status  05/28/2020 09:26 AM 6.6 (H) 4.8 - 5.6 % Final    Comment:    (NOTE) Pre diabetes:          5.7%-6.4%  Diabetes:              >6.4%  Glycemic control for   <7.0% adults with diabetes   03/09/2018 10:46 AM 6.3 (H) 4.8 - 5.6 % Final    Comment:    (NOTE) Pre diabetes:          5.7%-6.4% Diabetes:              >6.4% Glycemic control for   <7.0% adults with diabetes    CBG: Recent Labs  Lab 11/08/23 0404  GLUCAP 142*   Review of Systems:   Review of systems completed with pertinent positives/negatives outlined in above HPI.  Past Medical History:  She,  has a past medical history of Diabetes mellitus, Hypertension, and Primary localized osteoarthritis of right knee (03/08/2018).   Surgical History:   Past Surgical History:  Procedure Laterality  Date   ABDOMINAL HYSTERECTOMY     PARATHYROIDECTOMY Right 06/06/2020   Procedure: RIGHT PARATHYROIDECTOMY;  Surgeon: Darnell Level, MD;  Location: [facility] ORS;  Service: General;  Laterality: Right;   THYROID LOBECTOMY Right 06/06/2020   Procedure: RIGHT THYROID LOBECTOMY;  Surgeon: Darnell Level, MD;  Location: [facility] ORS;  Service: General;  Laterality: Right;   TOTAL KNEE ARTHROPLASTY Right 03/20/2018   Procedure: RIGHT TOTAL KNEE ARTHROPLASTY;  Surgeon: Salvatore Marvel, MD;  Location: [facility] OR;  Service: Orthopedics;  Laterality: Right;   Social History:   reports that she has never smoked. She has never used smokeless tobacco. She reports that she does not drink alcohol and does not use drugs.   Family History:  Her family history includes Hypertension in her mother; Leukemia in her father; Obesity in her sister.   Allergies: No Known Allergies   Home Medications: Prior to Admission medications   Medication Sig Start Date End Date Taking? Authorizing Provider  alendronate (FOSAMAX) 70 MG tablet  Take by mouth. 08/19/20   [provider]  aspirin EC 81 MG tablet Take 81 mg by mouth at bedtime. Swallow whole.    [provider]  atenolol (TENORMIN) 25 MG tablet Take 25 mg by mouth daily.    [provider]  Calcium Carb-Cholecalciferol (CALCIUM 600+D3 PO) Take 1 tablet by mouth in the morning and at bedtime.    [provider]  Cholecalciferol (D3) 50 MCG (2000 UT) TABS  08/15/20   [provider]  furosemide (LASIX) 20 MG tablet Take 20 mg by mouth daily as needed (fluid retention/ankle swelling).  09/24/19   [provider]  levothyroxine (SYNTHROID) 50 MCG tablet Take by mouth. 08/19/20   [provider]  levothyroxine (SYNTHROID) 50 MCG tablet Take by mouth. 12/01/21   [provider]  lisinopril (PRINIVIL,ZESTRIL) 10 MG tablet Take 1 tablet (10 mg total) by mouth daily. 09/13/18   Dyann Kief, PA-C  lisinopril (ZESTRIL) 10 MG  tablet Take 1 tablet by mouth daily. 12/02/21   [provider]  metFORMIN (GLUCOPHAGE-XR) 500 MG 24 hr tablet Take 1,000 mg by mouth daily.  08/01/18   [provider]  Polyethyl Glycol-Propyl Glycol (LUBRICANT EYE DROPS) 0.4-0.3 % SOLN Place 1 drop into both eyes at bedtime as needed (dry eyes.).    [provider]  spironolactone (ALDACTONE) 25 MG tablet Take 1 tablet (25 mg total) by mouth daily. 08/14/18 05/14/22  Quintella Reichert, MD    Critical care time:   The patient is critically ill with multiple organ system failure and requires high complexity decision making for assessment and support, frequent evaluation and titration of therapies, advanced monitoring, review of radiographic studies and interpretation of complex data.   Critical Care Time devoted to patient care services, exclusive of separately billable procedures, described in this note is 33 minutes.  Tim Lair, PA-C [facility] 11/08/23 7:02 AM  Please see [email or web address] for pager details.  From 7A-7P if no response, please call [phone] After hours, please call ELink [phone]

## 2023-11-08 NOTE — Plan of Care (Addendum)
Plan of Care Note for accepted transfer  Patient: Nicole [name]              OZH:[id number]  DOA: 11/07/2023     Facility requesting transfer: [facility] emergency department Requesting Provider: Margit Hanks, PA and Dr. Nicanor Alcon  Reason for transfer: SIRS, obstructive uropathy and acute kidney injury,  Facility course:  Nicole [name] is a 77 y.o. female history of diabetes, GERD, hyperparathyroidism presented with right lower quadrant pain that began today with nausea without vomiting.  Patient denies fevers and is unsure of sick contacts.  Patient denies any dysuria or hematuria.  Patient states that she has right lower quadrant pain and is concerned that maybe her appendix that she still has this.  Patient denies any constipation or diarrhea, chest pain, shortness of breath, emesis.  Patient states she is persistently nauseous with dry heaving but has not been eating and so has not been vomitin   Presentation to ED patient is hemodynamically stable  CMP unremarkable except elevated creatinine 1.05, hypercalcemia 10.5. CBC showing leukocytosis 15.3. Left acid 1.8 within normal range.  Respiratory panel unremarkable. Blood cultures are in process.  Chest x-ray no acute disease process.  Chronic elevation of right diaphragm.  CT abdomen pelvis: 1. Mild right hydroureteronephrosis upstream from a 8 mm stone in the distal right ureter. 2. Cholelithiasis without evidence of acute cholecystitis. 3. Colonic diverticulosis without diverticulitis. 4. Fat containing left ventral abdominal wall hernia.  ED provider spoke with Darnelle Spangle spoke with Dr. Annabell Howells urology recommended to admit under hospitalist care and will see patient in the morning.  Received signout from Dr. Nicanor Alcon which is about the same.  In the ED patient received ceftriaxone 2 g.  Update, received in chart message from Dr. Annabell Howells as it is going to delay the patient care at [facility] he requested to  transfer patient to [facility], patient probably going to OR first for the stent placement  Second update, Dr. Annabell Howells will going to take patient to the OR and then after the stent placement [facility] will be reconsulted/informed for admission.  Plan of care: The patient is accepted for admission for inpatient status to Telemetry unit, at [facility].  Check [email or web address] for on-call coverage.  [facility] will assume care on arrival to accepting facility. Until arrival, medical decision making responsibilities remain with the EDP.  However, [facility] available 24/7 for questions and assistance.   Nursing staff please page [facility] Admits and Consults [phone]) as soon as the patient arrives to the hospital.    Author: Tereasa Coop, MD  11/08/2023  [facility]

## 2023-11-08 NOTE — Consult Note (Signed)
Subjective: 1. Sepsis, due to unspecified organism, unspecified whether acute organ dysfunction present (HCC)   2. Cystitis   3. Nephrolithiasis      Consult requested by Dr.April Joslyn Hy is a 77 yo female who presented to the ER with RLQ pain with N/V.  She was found to have an 8mm right distal stone with mild hydro and a nit+ urine with pyuria and bacteria with a leukocytosis and low grade fever concerning for early sepsis.  Her lactate is 1.8.  She has a mild AKI with a Cr of 1.05 and glucose of 150.  She has a history of hyperparathyroidism with a parathyroidectomy in the past.  Her calcium is 10.5.  she has had no voiding symptoms or hematuria.  Her pain is currently controlled.  ROS:  Review of Systems  All other systems reviewed and are negative.   No Known Allergies  Past Medical History:  Diagnosis Date   Diabetes mellitus    dx 5 - 6 yrs ago.   Hypertension    Primary localized osteoarthritis of right knee 03/08/2018    Past Surgical History:  Procedure Laterality Date   ABDOMINAL HYSTERECTOMY     PARATHYROIDECTOMY Right 06/06/2020   Procedure: RIGHT PARATHYROIDECTOMY;  Surgeon: Darnell Level, MD;  Location: [facility];  Service: General;  Laterality: Right;   THYROID LOBECTOMY Right 06/06/2020   Procedure: RIGHT THYROID LOBECTOMY;  Surgeon: Darnell Level, MD;  Location: [facility];  Service: General;  Laterality: Right;   TOTAL KNEE ARTHROPLASTY Right 03/20/2018   Procedure: RIGHT TOTAL KNEE ARTHROPLASTY;  Surgeon: Salvatore Marvel, MD;  Location: [facility];  Service: Orthopedics;  Laterality: Right;    Social History   Socioeconomic History   Marital status: Married    Spouse name: Not on file   Number of children: Not on file   Years of education: Not on file   Highest education level: Not on file  Occupational History   Not on file  Tobacco Use   Smoking status: Never   Smokeless tobacco: Never  Vaping Use   Vaping status: Never Used  Substance and Sexual  Activity   Alcohol use: No   Drug use: Never   Sexual activity: Not on file  Other Topics Concern   Not on file  Social History Narrative   Not on file   Social Drivers of Health   Financial Resource Strain: Low Risk  (05/25/2022)   [facility] to [facility] visits prior to 01/22/2023., [facility]   Overall Financial Resource Strain (CARDIA)    Difficulty of Paying Living Expenses: Not hard at all  Food Insecurity: Low Risk  (05/30/2023)   Received from [facility]   Hunger Vital Sign    Worried About Running Out of Food in the Last Year: Never true    Ran Out of Food in the Last Year: Never true  Transportation Needs: Not on file (05/30/2023)  Physical Activity: Sufficiently Active (05/25/2022)   [facility] prior to 01/22/2023., [facility] visits prior to 01/22/2023., [facility]   Exercise Vital Sign    Days of Exercise per Week: 2 days    Minutes of Exercise per Session: 90 min  Stress: No Stress Concern Present (05/25/2022)   [facility] prior to 01/22/2023[facility], [facility] visits prior  to 01/22/2023., [facility]   [employer] of Occupational Health - Occupational Stress Questionnaire    Feeling of Stress : Not at all  Social Connections: Unknown (05/25/2022)   [facility], [facility] visits prior to 01/22/2023., [facility] visits prior to 01/22/2023., [facility]   Social Connection and Isolation Panel [NHANES]    Frequency of Communication with Friends and Family: Three times a week    Frequency of Social Gatherings with Friends and Family: Twice a week    Attends Religious Services: Patient declined    Active Member of Clubs or Organizations: Yes    Attends Tax inspector Meetings: 1 to 4 times per year    Marital Status: Married  Catering manager Violence: Not At Risk (05/25/2022)   [facility] visits prior to 01/22/2023., [facility] visits prior to 01/22/2023.   Humiliation, Afraid, Rape, and Kick questionnaire    Fear of Current or Ex-Partner: No    Emotionally Abused: No    Physically Abused: No    Sexually Abused: No    Family History  Problem Relation Age of Onset   Hypertension Mother    Leukemia Father    Obesity Sister     Anti-infectives: Anti-infectives (From admission, onward)    Start     Dose/Rate Route Frequency Ordered Stop   11/07/23 2145  cefTRIAXone (ROCEPHIN) 2 g in sodium chloride 0.9 % 100 mL IVPB        2 g 200 mL/hr over 30 Minutes Intravenous Every 24 hours 11/07/23 2141 11/14/23 2144       Current Facility-Administered Medications  Medication Dose Route Frequency Provider Last Rate Last Admin   cefTRIAXone (ROCEPHIN) 2 g in sodium chloride 0.9 % 100 mL IVPB  2 g Intravenous Q24H Schuman, James T, PA-C 200 mL/hr at 11/07/23 2212 2 g at 11/07/23 2212   Current Outpatient Medications  Medication Sig Dispense Refill   alendronate (FOSAMAX) 70 MG tablet Take by mouth.     aspirin EC 81 MG tablet Take 81 mg by mouth at bedtime. Swallow whole.     atenolol (TENORMIN) 25 MG tablet Take 25 mg by mouth daily.     Calcium Carb-Cholecalciferol (CALCIUM 600+D3 PO) Take 1 tablet by mouth in the morning and at bedtime.     Cholecalciferol (D3) 50 MCG (2000 UT) TABS      furosemide (LASIX) 20 MG tablet Take 20 mg by mouth daily as needed (fluid retention/ankle swelling).      levothyroxine (SYNTHROID) 50 MCG tablet Take by mouth.     levothyroxine (SYNTHROID) 50 MCG tablet Take by mouth.     lisinopril (PRINIVIL,ZESTRIL) 10 MG tablet Take 1 tablet (10 mg total) by mouth daily. 90 tablet 3   lisinopril (ZESTRIL) 10 MG tablet Take 1 tablet by mouth daily.      metFORMIN (GLUCOPHAGE-XR) 500 MG 24 hr tablet Take 1,000 mg by mouth daily.   3   Polyethyl Glycol-Propyl Glycol (LUBRICANT EYE DROPS) 0.4-0.3 % SOLN Place 1 drop into both eyes at bedtime as needed (dry eyes.).     spironolactone (ALDACTONE) 25 MG tablet Take 1 tablet (25 mg total) by mouth daily. 90 tablet 3     Objective: Vital signs in last 24 hours: BP 120/65   Pulse 76   Temp 99.9 F (37.7 C) (Oral)   Resp (!) 26   Ht 5\' 3"  (1.6 m)  Wt 67.6 kg   SpO2 98%   BMI 26.39 kg/m   Intake/Output from previous day: No intake/output data recorded. Intake/Output this shift: No intake/output data recorded.   Physical Exam Vitals reviewed.  Constitutional:      General: She is not in acute distress.    Appearance: Normal appearance. She is not ill-appearing.  Cardiovascular:     Rate and Rhythm: Normal rate and regular rhythm.     Heart sounds: Normal heart sounds.  Pulmonary:     Effort: Pulmonary effort is normal. No respiratory distress.     Breath sounds: Normal breath sounds.  Abdominal:     General: Abdomen is flat.     Palpations: Abdomen is soft.     Tenderness: There is no abdominal tenderness. There is no right CVA tenderness or left CVA tenderness.  Musculoskeletal:        General: No swelling or tenderness. Normal range of motion.  Skin:    General: Skin is warm and dry.  Neurological:     General: No focal deficit present.     Mental Status: She is alert and oriented to person, place, and time.  Psychiatric:        Mood and Affect: Mood normal.        Behavior: Behavior normal.     Lab Results:  Results for orders placed or performed during the hospital encounter of 11/07/23 (from the past 24 hours)  CBC     Status: Abnormal   Collection Time: 11/07/23  6:45 PM  Result Value Ref Range   WBC 15.3 (H) 4.0 - 10.5 K/uL   RBC 4.61 3.87 - 5.11 MIL/uL   Hemoglobin 14.0 12.0 - 15.0 g/dL   HCT 16.1 09.6 - 04.5 %   MCV 93.7 80.0 - 100.0 fL   MCH 30.4 26.0 -  34.0 pg   MCHC 32.4 30.0 - 36.0 g/dL   RDW 40.9 81.1 - 91.4 %   Platelets 223 150 - 400 K/uL   nRBC 0.0 0.0 - 0.2 %  Urinalysis, Routine w reflex microscopic -Urine, Clean Catch     Status: Abnormal   Collection Time: 11/07/23  6:45 PM  Result Value Ref Range   Color, Urine YELLOW YELLOW   APPearance HAZY (A) CLEAR   Specific Gravity, Urine 1.027 1.005 - 1.030   pH 6.0 5.0 - 8.0   Glucose, UA NEGATIVE NEGATIVE mg/dL   Hgb urine dipstick SMALL (A) NEGATIVE   Bilirubin Urine NEGATIVE NEGATIVE   Ketones, ur 40 (A) NEGATIVE mg/dL   Protein, ur 30 (A) NEGATIVE mg/dL   Nitrite POSITIVE (A) NEGATIVE   Leukocytes,Ua LARGE (A) NEGATIVE   RBC / HPF 6-10 0 - 5 RBC/hpf   WBC, UA >50 0 - 5 WBC/hpf   Bacteria, UA MANY (A) NONE SEEN   Squamous Epithelial / HPF 0-5 0 - 5 /HPF   Mucus PRESENT   Comprehensive metabolic panel     Status: Abnormal   Collection Time: 11/07/23  8:29 PM  Result Value Ref Range   Sodium 142 135 - 145 mmol/L   Potassium 3.9 3.5 - 5.1 mmol/L   Chloride 104 98 - 111 mmol/L   CO2 29 22 - 32 mmol/L   Glucose, Bld 150 (H) 70 - 99 mg/dL   BUN 21 8 - 23 mg/dL   Creatinine, Ser 7.82 (H) 0.44 - 1.00 mg/dL   Calcium 95.6 (H) 8.9 - 10.3 mg/dL   Total Protein 7.1 6.5 - 8.1 g/dL  Albumin 4.4 3.5 - 5.0 g/dL   AST 13 (L) 15 - 41 U/L   ALT 8 0 - 44 U/L   Alkaline Phosphatase 46 38 - 126 U/L   Total Bilirubin 1.0 <1.2 mg/dL   GFR, Estimated 55 (L) >60 mL/min   Anion gap 9 5 - 15  Lipase, blood     Status: Abnormal   Collection Time: 11/07/23  8:29 PM  Result Value Ref Range   Lipase <10 (L) 11 - 51 U/L  Resp panel by RT-PCR (RSV, Flu A&B, Covid) Anterior Nasal Swab     Status: None   Collection Time: 11/07/23 10:01 PM   Specimen: Anterior Nasal Swab  Result Value Ref Range   SARS Coronavirus 2 by RT PCR NEGATIVE NEGATIVE   Influenza A by PCR NEGATIVE NEGATIVE   Influenza B by PCR NEGATIVE NEGATIVE   Resp Syncytial Virus by PCR NEGATIVE NEGATIVE  Lactic acid, plasma      Status: None   Collection Time: 11/07/23 10:02 PM  Result Value Ref Range   Lactic Acid, Venous 1.8 0.5 - 1.9 mmol/L    BMET Recent Labs    11/07/23 2029  NA 142  K 3.9  CL 104  CO2 29  GLUCOSE 150*  BUN 21  CREATININE 1.05*  CALCIUM 10.5*   PT/INR No results for input(s): "LABPROT", "INR" in the last 72 hours. ABG No results for input(s): "PHART", "HCO3" in the last 72 hours.  Invalid input(s): "PCO2", "PO2"  Studies/Results: CT ABDOMEN PELVIS W CONTRAST Result Date: 11/07/2023 CLINICAL DATA:  UTI, vomiting, leukocytosis EXAM: CT ABDOMEN AND PELVIS WITH CONTRAST TECHNIQUE: Multidetector CT imaging of the abdomen and pelvis was performed using the standard protocol following bolus administration of intravenous contrast. RADIATION DOSE REDUCTION: This exam was performed according to the departmental dose-optimization program which includes automated exposure control, adjustment of the mA and/or kV according to patient size and/or use of iterative reconstruction technique. CONTRAST:  85mL OMNIPAQUE IOHEXOL 300 MG/ML  SOLN COMPARISON:  CT pelvis 05/25/2019 FINDINGS: Lower chest: No acute abnormality. Hepatobiliary: Unremarkable liver. Cholelithiasis without evidence of acute cholecystitis. No biliary dilation. Pancreas: Unremarkable. Spleen: Unremarkable. Adrenals/Urinary Tract: Normal adrenal glands. Delayed right nephrogram. Nonobstructing stone in the right kidney. Mild right hydroureteronephrosis upstream from a 8 mm stone in the distal right ureter. No urinary calculi or hydronephrosis on the left. Bladder is nondistended. Stomach/Bowel: Normal caliber large and small bowel. Colonic diverticulosis without diverticulitis. No bowel wall thickening. The appendix is normal.Stomach is within normal limits. Vascular/Lymphatic: Aortic atherosclerosis. No enlarged abdominal or pelvic lymph nodes. Reproductive: Unremarkable. Other: No free intraperitoneal fluid or air. Fat containing left  ventral abdominal wall hernia. Musculoskeletal: No acute fracture. Remote left pubic rami fractures. IMPRESSION: 1. Mild right hydroureteronephrosis upstream from a 8 mm stone in the distal right ureter. 2. Cholelithiasis without evidence of acute cholecystitis. 3. Colonic diverticulosis without diverticulitis. 4. Fat containing left ventral abdominal wall hernia. Aortic Atherosclerosis (ICD10-I70.0). Electronically Signed   By: Minerva Fester M.D.   On: 11/07/2023 23:21   DG Chest Port 1 View Result Date: 11/07/2023 CLINICAL DATA:  Possible sepsis EXAM: PORTABLE CHEST 1 VIEW COMPARISON:  05/28/2020 FINDINGS: Chronic elevation of right diaphragm. No focal opacity or pleural effusion. Normal cardiac size. No pneumothorax IMPRESSION: No active disease. Chronic elevation of right diaphragm. Electronically Signed   By: Jasmine Pang M.D.   On: 11/07/2023 23:20   I have communicated with the EDP and reviewed the pertinent imaging, labs and notes.  Assessment/Plan: 8mm Right distal stone with obstruction and sepsis.   I will have her transferred to [facility] for emergent cystoscopy and right ureteral stenting.   Risks of bleeding, infection, urinary tract injury, need for secondary procedure, thrombotic events and anesthetic complications reviewed.   She will need subsequent ureteroscopy for stone removal.         No follow-ups on file.    CC: Dr. April Palumbo.     Bjorn Pippin 11/08/2023

## 2023-11-08 NOTE — Progress Notes (Signed)
Patient in recovery hypotensive after having a cystoscopy procedure. MDA ordered albumin to be administered but patient still hypotensive. Admitting MD, Dr. Loney Loh came to assess patient in PACU, and she noted the pressure to be too low for patient to be admitted to the tele unit. She ordered a fluid bolus to be given and she consulted CCM. CCM will come and see patient. No new orders at this time. Patient is stable and resting comfortably currently.

## 2023-11-08 NOTE — Anesthesia Postprocedure Evaluation (Signed)
Anesthesia Post Note  Patient: Demonica Annon [place]  Procedure(s) Performed: 1.  Cystoscopy with right retrograde pyelogram and interpretation. 2.  Cystoscopy with insertion of right double-J stent. 3.  Application of fluoroscopy. (Right: Ureter)     Patient location during evaluation: PACU Anesthesia Type: General Level of consciousness: patient cooperative, oriented and sedated Pain management: pain level controlled Vital Signs Assessment: post-procedure vital signs reviewed and stable Respiratory status: spontaneous breathing, nonlabored ventilation and respiratory function stable Cardiovascular status: stable (requiring vasopressor support) Postop Assessment: no apparent nausea or vomiting Anesthetic complications: no Comments: Pt infected, requiring vasopressor support. Critical care assuming care in the PACU.    No notable events documented.  Last Vitals:  Vitals:   11/08/23 0615 11/08/23 0630  BP: (!) 88/45 (!) 98/49  Pulse: 64 64  Resp: 19 20  Temp:    SpO2: 94% 93%    Last Pain:  Vitals:   11/08/23 0630  TempSrc:   PainSc: 0-No pain                 Julian Askin,E. Kenwood Rosiak

## 2023-11-08 NOTE — Progress Notes (Signed)
HR dropped into upper 30s, no change in BP. Tele sinus brady. EKG without dropped beats.   Con't holding Bblocker, monitor on tele. Need to consult Cardiology if persistent as her sepsis resolves.

## 2023-11-08 NOTE — Consult Note (Signed)
Medical Consultation   NILAYA [name]  QMV:[id number]  DOB: [birth year]  DOA: 11/07/2023  PCP: Richmond Campbell., PA-C   Requesting physician: Dr. Annabell Howells  Reason for consultation: Obstructing right distal ureter stone   History of Present Illness: Nicole [name] is an 77 y.o. female hypertension, type 2 diabetes, primary hyperparathyroidism status post right parathyroidectomy and right thyroid lobectomy in July 2021, right knee osteoarthritis status post total knee arthroplasty in April 2019 presented to ED with complaints of right lower quadrant abdominal pain, nausea, and dry heaving.  Vital signs stable on arrival to the ED.  No fever, tachycardia, or hypotension.  Labs notable for WBC count 15.3, glucose 150, creatinine 1.0 (baseline 0.7-0.8), calcium 10.5, albumin 4.4, no elevation of lipase or LFTs, lactic acid normal.  UA with signs of infection (positive nitrite, large amount of leukocytes, and microscopy showing >50 WBCs and many bacteria).  Urine culture pending.  Chest x-ray showing chronic elevation of right diaphragm and no active disease.  CT abdomen pelvis showing mild right hydroureteronephrosis upstream from a 8 mm stone in the distal right ureter. Patient was given Tylenol, Zofran, and ceftriaxone in the ED. Urology consulted and patient was transferred to [facility] for emergent cystoscopy and right ureteral stenting.  Patient has finished surgery and Dr. Annabell Howells requesting hospitalist admission.  Foley catheter placed and urology will arrange for ureteroscopy for stone removal in a few weeks.   Patient seen in PACU.  Resting comfortably.  She reports over 24-hour history of right lower quadrant abdominal pain, nausea, and vomiting.  Denies fevers or chills.  Denies any abdominal pain at present.  Denies cough, shortness breath, or chest pain.  No other complaints.  Review of Systems:  Review of Systems  All other systems reviewed and are  negative.  Past Medical History: Past Medical History:  Diagnosis Date   Diabetes mellitus    dx 5 - 6 yrs ago.   Hypertension    Primary localized osteoarthritis of right knee 03/08/2018    Past Surgical History: Past Surgical History:  Procedure Laterality Date   ABDOMINAL HYSTERECTOMY     PARATHYROIDECTOMY Right 06/06/2020   Procedure: RIGHT PARATHYROIDECTOMY;  Surgeon: Darnell Level, MD;  Location: [facility] ORS;  Service: General;  Laterality: Right;   THYROID LOBECTOMY Right 06/06/2020   Procedure: RIGHT THYROID LOBECTOMY;  Surgeon: Darnell Level, MD;  Location: [facility] ORS;  Service: General;  Laterality: Right;   TOTAL KNEE ARTHROPLASTY Right 03/20/2018   Procedure: RIGHT TOTAL KNEE ARTHROPLASTY;  Surgeon: Salvatore Marvel, MD;  Location: [facility] OR;  Service: Orthopedics;  Laterality: Right;     Allergies:  No Known Allergies   Social History:  reports that she has never smoked. She has never used smokeless tobacco. She reports that she does not drink alcohol and does not use drugs.   Family History: Family History  Problem Relation Age of Onset   Hypertension Mother    Leukemia Father    Obesity Sister       Physical Exam: Vitals:   11/08/23 0400 11/08/23 0415 11/08/23 0430 11/08/23 0445  BP: (!) 68/59 (!) 83/45 (!) 79/43 (!) 83/40  Pulse: 73 70 73 68  Resp: 18 20 18  (!) 21  Temp:      TempSrc:      SpO2: 93% 90% 93% 96%  Weight:      Height:  Physical Exam Vitals reviewed.  Constitutional:      General: She is not in acute distress. HENT:     Head: Normocephalic and atraumatic.  Eyes:     Extraocular Movements: Extraocular movements intact.  Cardiovascular:     Rate and Rhythm: Normal rate and regular rhythm.     Pulses: Normal pulses.  Pulmonary:     Effort: Pulmonary effort is normal. No respiratory distress.     Breath sounds: Normal breath sounds. No wheezing, rhonchi or rales.  Abdominal:     General: Bowel sounds are normal. There is no distension.      Palpations: Abdomen is soft.     Tenderness: There is no abdominal tenderness. There is no guarding.  Musculoskeletal:     Cervical back: Normal range of motion.     Right lower leg: No edema.     Left lower leg: No edema.  Skin:    General: Skin is warm and dry.  Neurological:     General: No focal deficit present.     Mental Status: She is alert and oriented to person, place, and time.       Data reviewed:  I have personally reviewed the recent labs and imaging studies  Pertinent Labs:      Inpatient Medications:   Scheduled Meds:  fentaNYL       Continuous Infusions:  albumin human     [MAR Hold] cefTRIAXone (ROCEPHIN)  IV 2 g (11/07/23 2212)     Radiological Exams on Admission: CT ABDOMEN PELVIS W CONTRAST Result Date: 11/07/2023 CLINICAL DATA:  UTI, vomiting, leukocytosis EXAM: CT ABDOMEN AND PELVIS WITH CONTRAST TECHNIQUE: Multidetector CT imaging of the abdomen and pelvis was performed using the standard protocol following bolus administration of intravenous contrast. RADIATION DOSE REDUCTION: This exam was performed according to the departmental dose-optimization program which includes automated exposure control, adjustment of the mA and/or kV according to patient size and/or use of iterative reconstruction technique. CONTRAST:  85mL OMNIPAQUE IOHEXOL 300 MG/ML  SOLN COMPARISON:  CT pelvis 05/25/2019 FINDINGS: Lower chest: No acute abnormality. Hepatobiliary: Unremarkable liver. Cholelithiasis without evidence of acute cholecystitis. No biliary dilation. Pancreas: Unremarkable. Spleen: Unremarkable. Adrenals/Urinary Tract: Normal adrenal glands. Delayed right nephrogram. Nonobstructing stone in the right kidney. Mild right hydroureteronephrosis upstream from a 8 mm stone in the distal right ureter. No urinary calculi or hydronephrosis on the left. Bladder is nondistended. Stomach/Bowel: Normal caliber large and small bowel. Colonic diverticulosis without  diverticulitis. No bowel wall thickening. The appendix is normal.Stomach is within normal limits. Vascular/Lymphatic: Aortic atherosclerosis. No enlarged abdominal or pelvic lymph nodes. Reproductive: Unremarkable. Other: No free intraperitoneal fluid or air. Fat containing left ventral abdominal wall hernia. Musculoskeletal: No acute fracture. Remote left pubic rami fractures. IMPRESSION: 1. Mild right hydroureteronephrosis upstream from a 8 mm stone in the distal right ureter. 2. Cholelithiasis without evidence of acute cholecystitis. 3. Colonic diverticulosis without diverticulitis. 4. Fat containing left ventral abdominal wall hernia. Aortic Atherosclerosis (ICD10-I70.0). Electronically Signed   By: Minerva Fester M.D.   On: 11/07/2023 23:21   DG Chest Port 1 View Result Date: 11/07/2023 CLINICAL DATA:  Possible sepsis EXAM: PORTABLE CHEST 1 VIEW COMPARISON:  05/28/2020 FINDINGS: Chronic elevation of right diaphragm. No focal opacity or pleural effusion. Normal cardiac size. No pneumothorax IMPRESSION: No active disease. Chronic elevation of right diaphragm. Electronically Signed   By: Jasmine Pang M.D.   On: 11/07/2023 23:20   EKG: Independently reviewed.  Sinus rhythm with first-degree AV block, LAFB.  No acute ischemic changes.   Impression/Recommendations  Complicated UTI/obstructive uropathy Patient presenting with 24-hour history of right lower quadrant abdominal pain, nausea, and vomiting.  Vital signs stable on arrival to the ED, no fever, tachycardia, or hypotension at that time.  WBC count 15.3.  Lactate was normal.  UA with signs of infection (positive nitrite, large amount of leukocytes, and microscopy showing >50 WBCs and many bacteria). CT abdomen pelvis showing mild right hydroureteronephrosis upstream from a 8 mm stone in the distal right ureter.  Patient was given a dose of ceftriaxone in the ED.  She was transferred to [facility] tonight and underwent emergent cystoscopy  and right ureteral stenting by urology.  Foley catheter was placed and urology will arrange for ureteroscopy for stone removal in a few weeks.  After surgery, urology requested admission by hospitalist service.  Patient seen in the PACU and noted to be hypotensive with systolic 70-80s.  Not tachycardic.  She did not receive any IV fluids in the ED and informed by RN that patient was given 600 mL IV fluids in the OR and a dose of IV albumin.  Given hypotension, patient is currently not stable for progressive care admission at this time.  I have ordered 1 L normal saline bolus.  Discussed with Dr. Annabell Howells and I have consulted critical care for ICU admission.  Continue IV fluid resuscitation for now but may need vasopressor support if she remains hypotensive.  Continue ceftriaxone.  Stat repeat CBC and lactate ordered.  No blood cultures drawn in the ED.  Urine culture pending.  Mild AKI In the setting of obstructing ureteral stone for which she has now undergone emergent cystoscopy and right ureteral stenting. Creatinine 1.0 (baseline 0.7-0.8).  Repeat labs ordered to check renal function.  Avoid nephrotoxic agents/hold home lisinopril, Lasix, and spironolactone.  Hypertension Hold all home antihypertensives in the setting of hypotension.  Non-insulin-dependent type 2 diabetes Glucose 150.  Last A1c 6.1 in July 2024, repeat ordered.  Placed on sensitive sliding scale insulin.  Hold metformin.  Primary hyperparathyroidism status post right parathyroidectomy and right thyroid lobectomy in July 2021 Calcium 10.5 on initial labs, repeat labs ordered.  TSH was normal on labs done in August 2024.  Continue Synthroid after pharmacy med rec is done to verify dose.  Time Spent: 60 minutes  John Giovanni M.D. [facility] 11/08/2023, 4:57 AM

## 2023-11-08 NOTE — Op Note (Signed)
Procedure: 1.  Cystoscopy with right retrograde pyelogram and interpretation. 2.  Cystoscopy with insertion of right double-J stent. 3.  Application of fluoroscopy.  Preop diagnosis: Right distal stone with infection.  Postop diagnosis: Same with pyonephrosis.  Surgeon: Dr. Bjorn Pippin.  Anesthesia: General.  Specimen: None.  Drains: 6 French by 24 cm right contour double-J stent without tether.  EBL: None.  Complications: None.  Indications: The patient is a 77 year old female who presented to the ER with right lower quadrant pain and nausea and vomiting.  She was found to have a urinary tract infection with a low-grade fever and a leukocytosis with a mild AKI.  It was felt that stenting was indicated.  Procedure: She had been given Rocephin in the ER and was clinically improving.  She was taken the operating room where general anesthetic was induced.  She was placed in the lithotomy position and fitted with PAS hose.  Her perineum and genitalia were prepped with Betadine solution she was draped in usual sterile fashion.  Cystoscopy was performed using a 21 [place] scope and 30 degree lens.  Examination revealed a normal urethra.  She had a cystocele requiring elevation the I piece to visualize the base of the bladder.  The bladder wall was smooth with a few areas of cystitis cystica and some strands of purulent material in the dependent portion of bladder suggesting the presence of pyonephrosis.  No tumors or stones were identified.  Ureteral orifices were normal.  The right ureteral orifice was cannulated with a 6 [place] open-ended catheter and Omnipaque was instilled.  There was a narrow intramural ureter approximately 1 cm in length with a filling defect above this and retained contrast from the CT and a dilated system more proximally.  A sensor wire was advanced through the open-ended catheter by the stone to the kidney and the open-ended catheter was removed.  There was brisk E  flux of purulent urine alongside the wire.  A 6 French by 24 cm contour double-J stent without tether was then advanced the kidney under fluoroscopic guidance.  The wire was removed, leaving a good coil in the kidney and a good coil in the bladder.  The cystoscope was removed and a 16 French Foley catheter was inserted.  The balloon was filled with 10 mL of sterile fluid and the catheter was placed to straight drainage.  She was taken down from lithotomy position, her anesthetic was reversed and she was moved recovery in stable condition.  They were no complications.  She will be admitted to medicine.

## 2023-11-08 NOTE — Anesthesia Procedure Notes (Signed)
Procedure Name: Intubation Date/Time: 11/08/2023 3:15 AM  Performed by: Edmonia Caprio, CRNAPre-anesthesia Checklist: Patient identified, Emergency Drugs available, Patient being monitored, Suction available and Timeout performed Patient Re-evaluated:Patient Re-evaluated prior to induction Oxygen Delivery Method: Circle system utilized Preoxygenation: Pre-oxygenation with 100% oxygen Induction Type: IV induction and Rapid sequence Laryngoscope Size: Miller and 2 Grade View: Grade I Tube type: Oral Tube size: 7.0 mm Number of attempts: 1 Airway Equipment and Method: Stylet Placement Confirmation: ETT inserted through vocal cords under direct vision, positive ETCO2 and breath sounds checked- equal and bilateral Secured at: 23 cm Tube secured with: Tape Dental Injury: Teeth and Oropharynx as per pre-operative assessment

## 2023-11-08 NOTE — Transfer of Care (Signed)
Immediate Anesthesia Transfer of Care Note  Patient: Nicole [name]  Procedure(s) Performed: CYSTOSCOPY WITH RETROGRADE PYELOGRAM/RIGHT URETERAL STENT PLACEMENT (Right)  Patient Location: PACU  Anesthesia Type:General  Level of Consciousness: awake  Airway & Oxygen Therapy: Patient Spontanous Breathing  Post-op Assessment: Report given to RN and Post -op Vital signs reviewed and stable  Post vital signs: Reviewed and stable  Last Vitals:  Vitals Value Taken Time  BP    Temp    Pulse 75 11/08/23 0359  Resp 23 11/08/23 0359  SpO2 94 % 11/08/23 0359  Vitals shown include unfiled device data.  Last Pain:  Vitals:   11/07/23 2239  TempSrc: Oral  PainSc:          Complications: No notable events documented.

## 2023-11-08 NOTE — Progress Notes (Signed)
Nicole [name] is a 77 y/o woman with septic shock and obstructive uropathy. She underwent stenting to relieve the obstruction with improvement in her symptoms. Remains on norepi.  BP (!) 107/53 (BP Location: Right Arm)   Pulse (!) 57   Temp 99.9 F (37.7 C) (Oral)   Resp (!) 25   Ht 5\' 3"  (1.6 m)   Wt 67.6 kg   SpO2 95%   BMI 26.39 kg/m  Healthy appearing woman sitting up in bed in NAD [place]/AT, eyes anicteric Breathing comfortably on RA, CTAB S1S2, RRR Abd soft, NT No peripheral edema, no rashes Awake, alert, moving extremities. Normal speech & answering questions appropriately.    Previous urine cultures reviewed> no previous culture data available No blood cultures collected this admission Urine culture for this admission pending   Assessment & plan:  Septic shock due to UTI with obstructive stone, s/p stent -NE to maintain MAP >65 -con't ceftriaxne -con't foley to decompress bladder -follow urine culture; unfortunately no blood cultures collected -appreciate urology's management -hold PTA atenolol, spiro, lisinopril  AKI -Strict I's/O -LR 50cc/h x 20 hr, ad lib oral intake  - Renally dose meds and avoid nephrotoxic meds - Monitor - Maintain adequate perfusion -hold PTA lisinopril, spiro  Hyperglycemia with DM; A1c 6.6 -SSI PRN -goal BG 140-180 -Carb modified diet -hold PTA metformin  Hypothyroidism -resume PTA synthroid  Ambulate, wean off pressors today. Advance diet to carb modified/ regular as tolerated. Daughter at bedside and updated during rounds.   This patient is critically ill with multiple organ system failure which requires frequent high complexity decision making, assessment, support, evaluation, and titration of therapies. This was completed through the application of advanced monitoring technologies and extensive interpretation of multiple databases. During this encounter critical care time was devoted to patient care services described in this  note for 20 minutes.  Steffanie Dunn, DO 11/08/23 8:46 AM [facility]  For contact information, see Amion. If no response to pager, please call PCCM consult pager. After hours, 7PM- 7AM, please call Elink.

## 2023-11-08 NOTE — Discharge Instructions (Signed)
We will contact you to schedule the ureteroscopy.

## 2023-11-08 NOTE — TOC CM/SW Note (Signed)
Transition of Care [facility]) - Inpatient Brief Assessment   Patient Details  Name: CATELEYA WALLEY MRN: [id number] Date of Birth: 05-Nov-1946  Transition of Care [facility]) CM/SW Contact:    Tom-Johnson, Hershal Coria, RN Phone Number: 11/08/2023, 2:27 PM   Clinical Narrative:  Patient presented to the ED with RLQ pain and N/V. Patient found to have an 8mm Rt Distal Stone with Pyuria, concerning for Sepsis.  Patient underwent Cystoscopy with Rt Retrograde Pyelogram and Interpretation, Cystoscopy with insertion of Rt double-J Stent and application of Fluoroscopy. Urology following.  Continues on IV abx and Levo.    From home with husband, has one supportive daughter, Retired, independent with care and able to drive self. Has access to canes and walker.  PCP is Richmond Campbell., PA-C and uses [facility] on [address] [place] in [place].  No TOC needs or recommendations noted at this time.  Patient not Medically ready for discharge.  CM will continue to follow as patient progresses with care towards discharge.            Transition of Care Asessment: Insurance and Status: Insurance coverage has been reviewed Patient has primary care physician: Yes Home environment has been reviewed: Yes Prior level of function:: Independent Prior/Current Home Services: No current home services Social Drivers of Health Review: SDOH reviewed no interventions necessary Readmission risk has been reviewed: Yes Transition of care needs: transition of care needs identified, TOC will continue to follow

## 2023-11-09 ENCOUNTER — Encounter (HOSPITAL_COMMUNITY): Payer: Self-pay | Admitting: Urology

## 2023-11-09 DIAGNOSIS — R6521 Severe sepsis with septic shock: Secondary | ICD-10-CM

## 2023-11-09 DIAGNOSIS — A419 Sepsis, unspecified organism: Secondary | ICD-10-CM | POA: Diagnosis not present

## 2023-11-09 DIAGNOSIS — N139 Obstructive and reflux uropathy, unspecified: Secondary | ICD-10-CM | POA: Diagnosis not present

## 2023-11-09 LAB — CBC
HCT: 35.6 % — ABNORMAL LOW (ref 36.0–46.0)
Hemoglobin: 11.4 g/dL — ABNORMAL LOW (ref 12.0–15.0)
MCH: 30.2 pg (ref 26.0–34.0)
MCHC: 32 g/dL (ref 30.0–36.0)
MCV: 94.4 fL (ref 80.0–100.0)
Platelets: 190 10*3/uL (ref 150–400)
RBC: 3.77 MIL/uL — ABNORMAL LOW (ref 3.87–5.11)
RDW: 13.8 % (ref 11.5–15.5)
WBC: 14.5 10*3/uL — ABNORMAL HIGH (ref 4.0–10.5)
nRBC: 0 % (ref 0.0–0.2)

## 2023-11-09 LAB — BASIC METABOLIC PANEL
Anion gap: 7 (ref 5–15)
BUN: 20 mg/dL (ref 8–23)
CO2: 26 mmol/L (ref 22–32)
Calcium: 9.2 mg/dL (ref 8.9–10.3)
Chloride: 107 mmol/L (ref 98–111)
Creatinine, Ser: 0.83 mg/dL (ref 0.44–1.00)
GFR, Estimated: >60 mL/min (ref >60)
Glucose, Bld: 95 mg/dL (ref 70–99)
Potassium: 3.3 mmol/L — ABNORMAL LOW (ref 3.5–5.1)
Sodium: 140 mmol/L (ref 135–145)

## 2023-11-09 LAB — GLUCOSE, CAPILLARY
Glucose-Capillary: 103 mg/dL — ABNORMAL HIGH (ref 70–99)
Glucose-Capillary: 96 mg/dL (ref 70–99)
Glucose-Capillary: 108 mg/dL — ABNORMAL HIGH (ref 70–99)
Glucose-Capillary: 129 mg/dL — ABNORMAL HIGH (ref 70–99)

## 2023-11-09 LAB — HEMOGLOBIN A1C
Hgb A1c MFr Bld: 6 % — ABNORMAL HIGH (ref 4.8–5.6)
Mean Plasma Glucose: 126 mg/dL

## 2023-11-09 MED ORDER — POTASSIUM CHLORIDE CRYS ER 20 MEQ PO TBCR
40.0000 meq | EXTENDED_RELEASE_TABLET | Freq: Once | ORAL | Status: AC
  Administered 2023-11-09: 40 meq via ORAL
  Filled 2023-11-09: qty 2

## 2023-11-09 MED ORDER — ACETAMINOPHEN 325 MG PO TABS
650.0000 mg | ORAL_TABLET | Freq: Four times a day (QID) | ORAL | Status: DC | PRN
  Administered 2023-11-09: 650 mg via ORAL
  Filled 2023-11-09: qty 2

## 2023-11-09 MED ORDER — GERHARDT'S BUTT CREAM
TOPICAL_CREAM | CUTANEOUS | Status: DC | PRN
  Filled 2023-11-09: qty 60

## 2023-11-09 MED ORDER — LACTATED RINGERS IV SOLN: INTRAVENOUS | Status: AC

## 2023-11-09 NOTE — Progress Notes (Signed)
NAME:  Nicole [name], MRN:  [id number], DOB:  1946/04/21, LOS: 1 ADMISSION DATE:  11/07/2023 CONSULTATION DATE:  11/08/2023 REFERRING MD:  Janalyn Shy - TR[facility] CHIEF COMPLAINT:  Hypotension, RLQ pain  History of Present Illness:  77 year old woman who presented to [facility] 12/16 as a transfer from [facility] for RLQ abdominal pain, nausea without vomiting. PMHx significant for HTN, T2DM, GERD, hyperparathyroidism, OA (R knee).   Patient initially presented to [facility] with RLQ abdominal pain and nausea. Denied fever/chills, CP/SOB, vomiting or diarrhea. Denies dysuria, no known sick contacts. Poor PO intake due to nausea. Labs were notable for WBC 15.3, Hgb 14, Plt 223. Na 142, K 3.9, CO2 29, Cr 1.05 (baseline 0.6), Ca 10.5. LFTs WNL. LA 1.8. CXR unremarkable with the exception of chronic elevation of R diaphragm. CT A/P with mild R hydroureteronephrosis upstream from a 8mm stone in the distal R ureter, cholelithiasis without evidence of cholecystitis, colonic diverticulosis, L ventral abdominal wall hernia. Urology was consulted with recommendation for transfer to [facility] for emergent cystoscopy and R ureteral stenting.  Taken to OR 12/17AM for cystoscopy and insertion of R double J ureteral stent. Intraoperative course was uncomplicated. Patient was transferred to PACU postoperatively where she unfortunately remained hypotensive.  PCCM consulted for ICU admission in the setting of hypotension and vasopressor initiation.  Pertinent Medical History:   Past Medical History:  Diagnosis Date   Diabetes mellitus    dx 5 - 6 yrs ago.   Hypertension    Primary localized osteoarthritis of right knee 03/08/2018   Significant Hospital Events: Including procedures, antibiotic start and stop dates in addition to other pertinent events   12/16 - Presented to [facility] with abdominal pain, nausea. CT +R hydroureteronephrosis, 8mm stone. Uro consulted. 12/17 - Transferred to [facility] for emergent cystoscopy and stent placement. Hypotensive  postop requiring low-dose Levo. PCCM consulted.  Interim History / Subjective:  Appetite is poor and she still has malaise. Still on 1 mcg norepi.   Objective:  Blood pressure (!) 123/52, pulse (!) 52, temperature 98.2 F (36.8 C), temperature source Oral, resp. rate 20, height 5\' 3"  (1.6 m), weight 77 kg, SpO2 94%.        Intake/Output Summary (Last 24 hours) at 11/09/2023 0819 Last data filed at 11/09/2023 0700 Gross per 24 hour  Intake 1819.85 ml  Output 880 ml  Net 939.85 ml   Filed Weights   11/07/23 1831 11/09/23 0500  Weight: 67.6 kg 77 kg   Physical Examination: General: elderly woman sitting up in the chair in NAD HEENT: [place]/AT, eyes anicteric Neuro: awake, alert, moving all extremities CV: S1S2, RRR PULM: breathing comfortably on RA, CTAB GI: soft, NT Extremities: no peripheral edema, no cyanosis Skin: warm, dry, no rashes.  Urine culture: pending  Resolved Hospital Problem List:    Assessment & Plan:   Septic shock due to UTI with obstructive stone, s/p stent -con't NE to maintain MAP >65 -con't holding antihypertensives -ceftriaxone -follow urine culture -con't foley to decompress bladder -appreciate urology's management  AKI -strict I/O -BMP this morning -orally rehydrate; since more IVF were opened can run out the bag, then STOP -renally dose meds, avoid nephrotoxic meds -maintain adequate perfusion -hold PTA lisinopril, spiro, atenolol   Hyperglycemia with DM; A1c 6.0 -SSI PRN -carb modified diet -holding PTA metformin -goal BG 140-180   Hypothyroidism -PTA synthroid  Daughter and husband updated at bedside with Urology.  Best Practice: (right click and "Reselect all SmartList Selections" daily)   Diet/type: Regular consistency (  see orders) DVT prophylaxis: LMWH GI prophylaxis: N/A Lines: N/A Foley:  Yes, and it is still needed Code Status:  full code Last date of multidisciplinary goals of care discussion [Pending]  Labs:   CBC: Recent Labs  Lab 11/07/23 1845 11/08/23 0812  WBC 15.3* 18.8*  HGB 14.0 11.7*  HCT 43.2 36.5  MCV 93.7 94.6  PLT 223 203   Basic Metabolic Panel: Recent Labs  Lab 11/07/23 2029 11/08/23 0812  NA 142 139  K 3.9 3.8  CL 104 108  CO2 29 22  GLUCOSE 150* 197*  BUN 21 18  CREATININE 1.05* 1.15*  CALCIUM 10.5* 8.9   GFR: Estimated Creatinine Clearance: 40.2 mL/min (A) (by C-G formula based on SCr of 1.15 mg/dL (H)). Recent Labs  Lab 11/07/23 1845 11/07/23 2202 11/08/23 0812  WBC 15.3*  --  18.8*  LATICACIDVEN  --  1.8 1.7   Liver Function Tests: Recent Labs  Lab 11/07/23 2029 11/08/23 0812  AST 13* 17  ALT 8 12  ALKPHOS 46 40  BILITOT 1.0 0.9  PROT 7.1 6.0*  ALBUMIN 4.4 3.3*   Recent Labs  Lab 11/07/23 2029  LIPASE <10*     Critical care time:   This patient is critically ill with multiple organ system failure which requires frequent high complexity decision making, assessment, support, evaluation, and titration of therapies. This was completed through the application of advanced monitoring technologies and extensive interpretation of multiple databases. During this encounter critical care time was devoted to patient care services described in this note for 31 minutes.  Steffanie Dunn, DO 11/09/23 10:06 AM [facility]  For contact information, see Amion. If no response to pager, please call PCCM consult pager. After hours, 7PM- 7AM, please call Elink.

## 2023-11-09 NOTE — Plan of Care (Signed)
Patient remains on [id number] at time of writing. Patient is still requiring 2 mcg/min of IV norepinephrine, which is being infused via PIV. IV Watch is in place of Levophed infusion. Patient's admission profile is completed, as much as was possible, overnight by this RN. Education assessment completed with the patient overnight by this RN.   Edit: 0420 hrs: Levophed weaned off as of 0030 hours.   Problem: Education: Goal: Knowledge of General Education information will improve Description: Including pain rating scale, medication(s)/side effects and non-pharmacologic comfort measures Outcome: Progressing   Problem: Health Behavior/Discharge Planning: Goal: Ability to manage health-related needs will improve Outcome: Progressing   Problem: Clinical Measurements: Goal: Ability to maintain clinical measurements within normal limits will improve Outcome: Progressing Goal: Will remain free from infection Outcome: Progressing Goal: Diagnostic test results will improve Outcome: Progressing Goal: Respiratory complications will improve Outcome: Progressing Goal: Cardiovascular complication will be avoided Outcome: Progressing   Problem: Activity: Goal: Risk for activity intolerance will decrease Outcome: Progressing   Problem: Nutrition: Goal: Adequate nutrition will be maintained Outcome: Progressing   Problem: Coping: Goal: Level of anxiety will decrease Outcome: Progressing   Problem: Elimination: Goal: Will not experience complications related to bowel motility Outcome: Progressing Goal: Will not experience complications related to urinary retention Outcome: Progressing   Problem: Pain Management: Goal: General experience of comfort will improve Outcome: Progressing   Problem: Safety: Goal: Ability to remain free from injury will improve Outcome: Progressing   Problem: Skin Integrity: Goal: Risk for impaired skin integrity will decrease Outcome: Progressing   Problem:  Education: Goal: Ability to describe self-care measures that may prevent or decrease complications (Diabetes Survival Skills Education) will improve Outcome: Progressing Goal: Individualized Educational Video(s) Outcome: Progressing   Problem: Coping: Goal: Ability to adjust to condition or change in health will improve Outcome: Progressing   Problem: Fluid Volume: Goal: Ability to maintain a balanced intake and output will improve Outcome: Progressing   Problem: Health Behavior/Discharge Planning: Goal: Ability to identify and utilize available resources and services will improve Outcome: Progressing Goal: Ability to manage health-related needs will improve Outcome: Progressing   Problem: Metabolic: Goal: Ability to maintain appropriate glucose levels will improve Outcome: Progressing   Problem: Nutritional: Goal: Maintenance of adequate nutrition will improve Outcome: Progressing Goal: Progress toward achieving an optimal weight will improve Outcome: Progressing   Problem: Skin Integrity: Goal: Risk for impaired skin integrity will decrease Outcome: Progressing   Problem: Tissue Perfusion: Goal: Adequacy of tissue perfusion will improve Outcome: Progressing

## 2023-11-09 NOTE — Progress Notes (Signed)
1 Day Post-Op Subjective: First time meeting patient.  She was sitting up in chair having breakfast, though her appetite has been poor.  Attempt was made to wean her off of Levophed, though had to put back at 1 mcg.  Her daughter and husband were at bedside and all questions were answered to their satisfaction.  Overall patient looks well  Objective: Vital signs in last 24 hours: Temp:  [97.8 F (36.6 C)-98.8 F (37.1 C)] 98.1 F (36.7 C) (12/18 1131) Pulse Rate:  [42-67] 53 (12/18 1145) Resp:  [12-29] 23 (12/18 1145) BP: (80-134)/(42-94) 120/57 (12/18 1145) SpO2:  [89 %-97 %] 95 % (12/18 1145) Weight:  [77 kg] 77 kg (12/18 0500)  Assessment/Plan: # Right ureteral stone # Sepsis  To the OR with Dr. Annabell Howells on 11/08/2023 for urgent right ureteral stent placement.  Patient will need to schedule ureteroscopy with laser lithotripsy in 1-3 weeks. Leukocytosis has improved overnight, patient has remained normothermic, and renal function has returned to baseline.  Okay to discharge from urologic perspective once medically cleared. Foley catheter removed We will continue to follow along with you.  Intake/Output from previous day: 12/17 0701 - 12/18 0700 In: 1931.1 [P.O.:510; I.V.:1221.2; IV Piggyback:199.9] Out: 880 [Urine:880]  Intake/Output this shift: Total I/O In: 124.3 [P.O.:120; I.V.:4.3] Out: -   Physical Exam:  General: Alert and oriented CV: No cyanosis Lungs: equal chest rise Abdomen: Soft, NTND, no rebound or guarding Gu: Foley catheter removed  Lab Results: Recent Labs    11/07/23 1845 11/08/23 0812 11/09/23 1119  HGB 14.0 11.7* 11.4*  HCT 43.2 36.5 35.6*   BMET Recent Labs    11/08/23 0812 11/09/23 1119  NA 139 140  K 3.8 3.3*  CL 108 107  CO2 22 26  GLUCOSE 197* 95  BUN 18 20  CREATININE 1.15* 0.83  CALCIUM 8.9 9.2     Studies/Results: DG Abd 1 View Result Date: 11/08/2023 CLINICAL DATA:  Status post right ureteral stent placement EXAM:  ABDOMEN - 1 VIEW COMPARISON:  CT abdomen pelvis 11/07/2023 FINDINGS: Radiation Exposure Index (as provided by the fluoroscopic device): 3.3 mGy Kerma Seven intraoperative fluoroscopic images were provided for interpretation. The submitted images demonstrate opacification of the right ureter. Filling defect within the distal ureter consistent with known obstructing calculus. There is mild right hydronephrosis and hydroureter. Final image demonstrates a stent extending from the right renal pelvis to the bladder. IMPRESSION: Intraoperative images demonstrating right ureteral stent placement. Electronically Signed   By: Acquanetta Belling M.D.   On: 11/08/2023 08:10   DG C-Arm 1-60 Min-No Report Result Date: 11/08/2023 Fluoroscopy was utilized by the requesting physician.  No radiographic interpretation.   CT ABDOMEN PELVIS W CONTRAST Result Date: 11/07/2023 CLINICAL DATA:  UTI, vomiting, leukocytosis EXAM: CT ABDOMEN AND PELVIS WITH CONTRAST TECHNIQUE: Multidetector CT imaging of the abdomen and pelvis was performed using the standard protocol following bolus administration of intravenous contrast. RADIATION DOSE REDUCTION: This exam was performed according to the departmental dose-optimization program which includes automated exposure control, adjustment of the mA and/or kV according to patient size and/or use of iterative reconstruction technique. CONTRAST:  85mL OMNIPAQUE IOHEXOL 300 MG/ML  SOLN COMPARISON:  CT pelvis 05/25/2019 FINDINGS: Lower chest: No acute abnormality. Hepatobiliary: Unremarkable liver. Cholelithiasis without evidence of acute cholecystitis. No biliary dilation. Pancreas: Unremarkable. Spleen: Unremarkable. Adrenals/Urinary Tract: Normal adrenal glands. Delayed right nephrogram. Nonobstructing stone in the right kidney. Mild right hydroureteronephrosis upstream from a 8 mm stone in the distal right ureter. No  urinary calculi or hydronephrosis on the left. Bladder is nondistended.  Stomach/Bowel: Normal caliber large and small bowel. Colonic diverticulosis without diverticulitis. No bowel wall thickening. The appendix is normal.Stomach is within normal limits. Vascular/Lymphatic: Aortic atherosclerosis. No enlarged abdominal or pelvic lymph nodes. Reproductive: Unremarkable. Other: No free intraperitoneal fluid or air. Fat containing left ventral abdominal wall hernia. Musculoskeletal: No acute fracture. Remote left pubic rami fractures. IMPRESSION: 1. Mild right hydroureteronephrosis upstream from a 8 mm stone in the distal right ureter. 2. Cholelithiasis without evidence of acute cholecystitis. 3. Colonic diverticulosis without diverticulitis. 4. Fat containing left ventral abdominal wall hernia. Aortic Atherosclerosis (ICD10-I70.0). Electronically Signed   By: Minerva Fester M.D.   On: 11/07/2023 23:21   DG Chest Port 1 View Result Date: 11/07/2023 CLINICAL DATA:  Possible sepsis EXAM: PORTABLE CHEST 1 VIEW COMPARISON:  05/28/2020 FINDINGS: Chronic elevation of right diaphragm. No focal opacity or pleural effusion. Normal cardiac size. No pneumothorax IMPRESSION: No active disease. Chronic elevation of right diaphragm. Electronically Signed   By: Jasmine Pang M.D.   On: 11/07/2023 23:20      LOS: 1 day   Elmon Kirschner, NP [facility] Pager: [phone]  11/09/2023, 12:56 PM

## 2023-11-09 NOTE — Plan of Care (Signed)
  Problem: Education: Goal: Knowledge of General Education information will improve Description: Including pain rating scale, medication(s)/side effects and non-pharmacologic comfort measures Outcome: Progressing   Problem: Health Behavior/Discharge Planning: Goal: Ability to manage health-related needs will improve Outcome: Progressing   Problem: Clinical Measurements: Goal: Ability to maintain clinical measurements within normal limits will improve Outcome: Progressing Goal: Will remain free from infection Outcome: Progressing Goal: Diagnostic test results will improve Outcome: Progressing Goal: Respiratory complications will improve Outcome: Progressing Goal: Cardiovascular complication will be avoided Outcome: Progressing   Problem: Coping: Goal: Level of anxiety will decrease Outcome: Progressing   Problem: Nutrition: Goal: Adequate nutrition will be maintained Outcome: Progressing   Problem: Pain Management: Goal: General experience of comfort will improve Outcome: Progressing

## 2023-11-10 DIAGNOSIS — R001 Bradycardia, unspecified: Secondary | ICD-10-CM

## 2023-11-10 DIAGNOSIS — R6521 Severe sepsis with septic shock: Secondary | ICD-10-CM | POA: Diagnosis not present

## 2023-11-10 DIAGNOSIS — A419 Sepsis, unspecified organism: Secondary | ICD-10-CM | POA: Diagnosis not present

## 2023-11-10 DIAGNOSIS — N139 Obstructive and reflux uropathy, unspecified: Secondary | ICD-10-CM | POA: Diagnosis not present

## 2023-11-10 LAB — BASIC METABOLIC PANEL
Anion gap: 7 (ref 5–15)
BUN: 18 mg/dL (ref 8–23)
CO2: 24 mmol/L (ref 22–32)
Calcium: 8.5 mg/dL — ABNORMAL LOW (ref 8.9–10.3)
Chloride: 107 mmol/L (ref 98–111)
Creatinine, Ser: 0.79 mg/dL (ref 0.44–1.00)
GFR, Estimated: >60 mL/min (ref >60)
Glucose, Bld: 95 mg/dL (ref 70–99)
Potassium: 3.9 mmol/L (ref 3.5–5.1)
Sodium: 138 mmol/L (ref 135–145)

## 2023-11-10 LAB — CBC
HCT: 34 % — ABNORMAL LOW (ref 36.0–46.0)
Hemoglobin: 10.6 g/dL — ABNORMAL LOW (ref 12.0–15.0)
MCH: 30.1 pg (ref 26.0–34.0)
MCHC: 31.2 g/dL (ref 30.0–36.0)
MCV: 96.6 fL (ref 80.0–100.0)
Platelets: 154 10*3/uL (ref 150–400)
RBC: 3.52 MIL/uL — ABNORMAL LOW (ref 3.87–5.11)
RDW: 13.6 % (ref 11.5–15.5)
WBC: 9 10*3/uL (ref 4.0–10.5)
nRBC: 0 % (ref 0.0–0.2)

## 2023-11-10 LAB — URINE CULTURE: Culture: >=100000 — AB

## 2023-11-10 LAB — GLUCOSE, CAPILLARY
Glucose-Capillary: 93 mg/dL (ref 70–99)
Glucose-Capillary: 84 mg/dL (ref 70–99)
Glucose-Capillary: 129 mg/dL — ABNORMAL HIGH (ref 70–99)

## 2023-11-10 LAB — MAGNESIUM: Magnesium: 1.4 mg/dL — ABNORMAL LOW (ref 1.7–2.4)

## 2023-11-10 MED ORDER — CEPHALEXIN 500 MG PO CAPS: 500.0000 mg | ORAL_CAPSULE | Freq: Four times a day (QID) | ORAL | 0 refills | Status: DC

## 2023-11-10 MED ORDER — MAGNESIUM SULFATE 4 GM/100ML IV SOLN
4.0000 g | Freq: Once | INTRAVENOUS | Status: AC
  Administered 2023-11-10: 4 g via INTRAVENOUS
  Filled 2023-11-10: qty 100

## 2023-11-10 MED ORDER — CEPHALEXIN 500 MG PO CAPS
500.0000 mg | ORAL_CAPSULE | Freq: Four times a day (QID) | ORAL | Status: DC
  Administered 2023-11-10: 500 mg via ORAL
  Filled 2023-11-10 (×2): qty 1

## 2023-11-10 NOTE — Consult Note (Signed)
Cardiology Consultation   Patient ID: [name] MRN: [id number]; DOB: 1945-12-25  Admit date: 11/07/2023 Date of Consult: 11/10/2023  PCP:  Richmond Campbell., PA-C   [facility] Providers Cardiologist:  Armanda Magic, MD        Patient Profile:   Nicole [name] is a 77 y.o. female with a hx of hypertension, DM type II, GERD, hyperparathyroidism, OA who is being seen 11/10/2023 for the evaluation of bradycardia at the request of Dr. Chestine Spore.  History of Present Illness:   Ms. Labrado presented to [facility] ED 12/16 with RLQ abdominal pain, nausea. Her nausea had resulted in decreased PO intake. Labs found leukocytosis at 15.3, HGB 14. Creatinine elevated from baseline to 1.05. CT A/P found right hydroureteronephrosis proximal to 8mm stone in distal R ureter. She was also noted with cholelithiasis, colonic diverticulosis, L ventral abdominal wall hernia. Urology consulted and patient transferred for emergent cystoscopy and R ureteral stenting. Patient underwent procedure on 12/17 and transferred to PACU where she was hypotensive. This prompted consult to PCCM for admission to ICU and she was started on vasopressive agents. Patient treated with norepinephrine on 12/18 as well as IVF before conversion to oral rehydration. Following emergent procedures, patient also bradycardic prompting cardiology consult. Patient on Atenolol 25mg  at baseline, has not taken this admission.   On my exam today, patient denies any recent dizziness/lightheadedness, pre-syncope, fatigue. She reports feeling very well until acute onset of above symptoms prompting visit to the ED. She is feeling much better today, has ambulated and again denies any symptoms suggestive of arrhythmia.   Past Medical History:  Diagnosis Date   Diabetes mellitus    dx 5 - 6 yrs ago.   Hypertension    Primary localized osteoarthritis of right knee 03/08/2018    Past Surgical History:  Procedure Laterality Date    ABDOMINAL HYSTERECTOMY     CYSTOSCOPY W/ URETERAL STENT PLACEMENT Right 11/08/2023   Procedure: 1.  Cystoscopy with right retrograde pyelogram and interpretation. 2.  Cystoscopy with insertion of right double-J stent. 3.  Application of fluoroscopy.;  Surgeon: Bjorn Pippin, MD;  Location: [facility] OR;  Service: Urology;  Laterality: Right;   PARATHYROIDECTOMY Right 06/06/2020   Procedure: RIGHT PARATHYROIDECTOMY;  Surgeon: Darnell Level, MD;  Location: [facility] ORS;  Service: General;  Laterality: Right;   THYROID LOBECTOMY Right 06/06/2020   Procedure: RIGHT THYROID LOBECTOMY;  Surgeon: Darnell Level, MD;  Location: [facility] ORS;  Service: General;  Laterality: Right;   TOTAL KNEE ARTHROPLASTY Right 03/20/2018   Procedure: RIGHT TOTAL KNEE ARTHROPLASTY;  Surgeon: Salvatore Marvel, MD;  Location: [facility] OR;  Service: Orthopedics;  Laterality: Right;     Home Medications:  Prior to Admission medications   Medication Sig Start Date End Date Taking? Authorizing Provider  aspirin EC 81 MG tablet Take 81 mg by mouth at bedtime. Swallow whole.   Yes [provider]  atenolol (TENORMIN) 25 MG tablet Take 25 mg by mouth daily.   Yes [provider]  Calcium Carb-Cholecalciferol (CALCIUM 600+D3 PO) Take 1 tablet by mouth in the morning and at bedtime.   Yes [provider]  Cholecalciferol (D3) 50 MCG (2000 UT) TABS Take 1 tablet by mouth daily. 08/15/20  Yes [provider]  furosemide (LASIX) 20 MG tablet Take 20 mg by mouth daily as needed (fluid retention/ankle swelling).  09/24/19  Yes [provider]  levothyroxine (SYNTHROID) 50 MCG tablet Take by mouth. 12/01/21  Yes [provider]  lisinopril (  PRINIVIL,ZESTRIL) 10 MG tablet Take 1 tablet (10 mg total) by mouth daily. 09/13/18  Yes Dyann Kief, PA-C  omeprazole (PRILOSEC) 40 MG capsule Take 40 mg by mouth daily. 09/05/23  Yes [provider]  spironolactone (ALDACTONE) 25 MG tablet Take 1 tablet (25 mg total)  by mouth daily. 08/14/18 11/08/23 Yes Turner, Cornelious Bryant, MD  cephALEXin (KEFLEX) 500 MG capsule Take 1 capsule (500 mg total) by mouth every 6 (six) hours. 11/10/23   Steffanie Dunn, DO  tiZANidine (ZANAFLEX) 4 MG tablet Take 4 mg by mouth every 12 (twelve) hours as needed for muscle spasms. Patient not taking: Reported on 11/08/2023 07/04/23   [provider]    Inpatient Medications: Scheduled Meds:  cephALEXin  500 mg Oral Q6H   Chlorhexidine Gluconate Cloth  6 each Topical Daily   enoxaparin (LOVENOX) injection  40 mg Subcutaneous Q24H   insulin aspart  0-5 Units Subcutaneous QHS   insulin aspart  0-9 Units Subcutaneous TID WC   levothyroxine  50 mcg Oral Q0600   pantoprazole  40 mg Oral Daily   Continuous Infusions:  PRN Meds: acetaminophen, docusate sodium, Gerhardt's butt cream, mouth rinse, polyethylene glycol  Allergies:   No Known Allergies  Social History:   Social History   Socioeconomic History   Marital status: Married    Spouse name: Not on file   Number of children: Not on file   Years of education: Not on file   Highest education level: Not on file  Occupational History   Not on file  Tobacco Use   Smoking status: Never   Smokeless tobacco: Never  Vaping Use   Vaping status: Never Used  Substance and Sexual Activity   Alcohol use: No   Drug use: Never   Sexual activity: Not on file  Other Topics Concern   Not on file  Social History Narrative   Not on file   Social Drivers of Health   Financial Resource Strain: Low Risk  (05/25/2022)   [facility] visits prior to 01/22/2023[facility] visits prior to 01/22/2023., [facility]   Overall Financial Resource Strain (CARDIA)    Difficulty of Paying Living Expenses: Not hard at all  Food Insecurity: Low Risk  (05/30/2023)   [facility]   Hunger Vital Sign    Worried About Running Out of Food in the Last Year:  Never true    Ran Out of Food in the Last Year: Never true  Transportation Needs: No Transportation Needs (11/08/2023)   PRAPARE - Administrator, Civil Service (Medical): No    Lack of Transportation (Non-Medical): No  Physical Activity: Sufficiently Active (05/25/2022)   [facility], [facility] visits prior to 01/22/2023., [facility] visits prior to 01/22/2023., [facility]   Exercise Vital Sign    Days of Exercise per Week: 2 days    Minutes of Exercise per Session: 90 min  Stress: No Stress Concern Present (05/25/2022)   [facility] prior to 01/22/2023., [facility], [facility] visits prior to 01/22/2023., [facility]   [employer] of Occupational Health - Occupational Stress Questionnaire    Feeling of Stress : Not at all  Social Connections: Unknown (05/25/2022)   [facility] from [facility], [facility] visits prior to 01/22/2023., [facility]  visits prior to 01/22/2023., [facility]   Social Connection and Isolation Panel [NHANES]    Frequency of Communication with Friends and Family: Three times a week    Frequency of Social Gatherings with Friends and Family: Twice a week    Attends Religious Services: Patient declined    Active Member of Clubs or Organizations: Yes    Attends Banker Meetings: 1 to 4 times per year    Marital Status: Married  Catering manager Violence: Not At Risk (05/25/2022)   [facility] prior to 01/22/2023., [facility] visits prior to 01/22/2023.   Humiliation, Afraid, Rape, and Kick questionnaire    Fear of Current or Ex-Partner: No    Emotionally Abused: No    Physically Abused: No    Sexually Abused: No    Family History:    Family History  Problem Relation Age of Onset   Hypertension Mother     Leukemia Father    Obesity Sister      ROS:  Please see the history of present illness.   All other ROS reviewed and negative.     Physical Exam/Data:   Vitals:   11/10/23 1126 11/10/23 1200 11/10/23 1300 11/10/23 1508  BP:  98/83 92/77   Pulse: (!) 57 63 63   Resp: 20 (!) 30 (!) 21   Temp: 98.5 F (36.9 C)   98.1 F (36.7 C)  TempSrc: Oral   Oral  SpO2: 97% 97% 98%   Weight:      Height:        Intake/Output Summary (Last 24 hours) at 11/10/2023 1517 Last data filed at 11/10/2023 4098 Gross per 24 hour  Intake 842.27 ml  Output 1350 ml  Net -507.73 ml      11/10/2023    5:00 AM 11/09/2023    5:00 AM 11/07/2023    6:31 PM  Last 3 Weights  Weight (lbs) 164 lb 10.9 oz 169 lb 12.1 oz 149 lb  Weight (kg) 74.7 kg 77 kg 67.586 kg     Body mass index is 29.17 kg/m.  General:  Well nourished, well developed, in no acute distress HEENT: normal Neck: no JVD Vascular: No carotid bruits; Distal pulses 2+ bilaterally Cardiac:  normal S1, S2; RRR; no murmur  Lungs:  clear to auscultation bilaterally, no wheezing, rhonchi or rales  Abd: soft, nontender, no hepatomegaly  Ext: no edema Musculoskeletal:  No deformities, BUE and BLE strength normal and equal Skin: warm and dry  Neuro:  CNs 2-12 intact, no focal abnormalities noted Psych:  Normal affect   EKG:  The EKG was personally reviewed and demonstrates:  sinus rhythm with isolated PVC Telemetry:  Telemetry was personally reviewed and demonstrates:  sinus rhythm, intermittent bradycardia into the low 50s with rare PACs and PVCs.   Relevant CV Studies:  08/21/2018 TTE  Study Conclusions   - Left ventricle: The cavity size was normal. There was moderate    focal basal and mild concentric hypertrophy of the septum.    Systolic function was normal. The estimated ejection fraction was    in the range of 55% to 60%. Although no diagnostic regional wall    motion abnormality was identified, this possibility cannot be     completely excluded on the basis of this study due to    foreshortening of images in several views. Doppler parameters are    consistent with abnormal left ventricular relaxation (grade 1  diastolic dysfunction).  - Aortic valve: There was mild to moderate regurgitation.  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  - Mitral valve: There was trivial regurgitation.  - Right ventricle: Systolic function was normal.  - Tricuspid valve: There was mild regurgitation.  - Pulmonic valve: There was no significant regurgitation.  - Pulmonary arteries: Systolic pressure was mildly increased. PA    peak pressure: 34 mm Hg (S).   Impressions:   - Normal LV EF without obvious wall motion abnormalities. Grade 1    diastolic dysfunction. 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   Laboratory Data:  High Sensitivity Troponin:  No results for input(s): "TROPONINIHS" in the last 720 hours.   Chemistry Recent Labs  Lab 11/08/23 0812 11/09/23 1119 11/10/23 0333  NA 139 140 138  K 3.8 3.3* 3.9  CL 108 107 107  CO2 22 26 24   GLUCOSE 197* 95 95  BUN 18 20 18   CREATININE 1.15* 0.83 0.79  CALCIUM 8.9 9.2 8.5*  MG  --   --  1.4*  GFRNONAA 49* >60 >60  ANIONGAP 9 7 7     Recent Labs  Lab 11/07/23 2029 11/08/23 0812  PROT 7.1 6.0*  ALBUMIN 4.4 3.3*  AST 13* 17  ALT 8 12  ALKPHOS 46 40  BILITOT 1.0 0.9   Lipids No results for input(s): "CHOL", "TRIG", "HDL", "LABVLDL", "LDLCALC", "CHOLHDL" in the last 168 hours.  Hematology Recent Labs  Lab 11/08/23 0812 11/09/23 1119 11/10/23 0333  WBC 18.8* 14.5* 9.0  RBC 3.86* 3.77* 3.52*  HGB 11.7* 11.4* 10.6*  HCT 36.5 35.6* 34.0*  MCV 94.6 94.4 96.6  MCH 30.3 30.2 30.1  MCHC 32.1 32.0 31.2  RDW 13.8 13.8 13.6  PLT 203 190 154   Thyroid No results for input(s): "TSH", "FREET4" in the last 168 hours.  BNPNo results for input(s): "BNP", "PROBNP" in the last 168  hours.  DDimer No results for input(s): "DDIMER" in the last 168 hours.   Radiology/Studies:  DG Abd 1 View Result Date: 11/08/2023 CLINICAL DATA:  Status post right ureteral stent placement EXAM: ABDOMEN - 1 VIEW COMPARISON:  CT abdomen pelvis 11/07/2023 FINDINGS: Radiation Exposure Index (as provided by the fluoroscopic device): 3.3 mGy Kerma Seven intraoperative fluoroscopic images were provided for interpretation. The submitted images demonstrate opacification of the right ureter. Filling defect within the distal ureter consistent with known obstructing calculus. There is mild right hydronephrosis and hydroureter. Final image demonstrates a stent extending from the right renal pelvis to the bladder. IMPRESSION: Intraoperative images demonstrating right ureteral stent placement. Electronically Signed   By: Acquanetta Belling M.D.   On: 11/08/2023 08:10   DG C-Arm 1-60 Min-No Report Result Date: 11/08/2023 Fluoroscopy was utilized by the requesting physician.  No radiographic interpretation.   CT ABDOMEN PELVIS W CONTRAST Result Date: 11/07/2023 CLINICAL DATA:  UTI, vomiting, leukocytosis EXAM: CT ABDOMEN AND PELVIS WITH CONTRAST TECHNIQUE: Multidetector CT imaging of the abdomen and pelvis was performed using the standard protocol following bolus administration of intravenous contrast. RADIATION DOSE REDUCTION: This exam was performed according to the departmental dose-optimization program which includes automated exposure control, adjustment of the mA and/or kV according to patient size and/or use of iterative reconstruction technique. CONTRAST:  85mL OMNIPAQUE IOHEXOL 300 MG/ML  SOLN COMPARISON:  CT pelvis 05/25/2019 FINDINGS: Lower chest: No acute abnormality. Hepatobiliary: Unremarkable liver. Cholelithiasis without evidence of acute cholecystitis. No biliary dilation.  Pancreas: Unremarkable. Spleen: Unremarkable. Adrenals/Urinary Tract: Normal adrenal glands. Delayed right nephrogram. Nonobstructing  stone in the right kidney. Mild right hydroureteronephrosis upstream from a 8 mm stone in the distal right ureter. No urinary calculi or hydronephrosis on the left. Bladder is nondistended. Stomach/Bowel: Normal caliber large and small bowel. Colonic diverticulosis without diverticulitis. No bowel wall thickening. The appendix is normal.Stomach is within normal limits. Vascular/Lymphatic: Aortic atherosclerosis. No enlarged abdominal or pelvic lymph nodes. Reproductive: Unremarkable. Other: No free intraperitoneal fluid or air. Fat containing left ventral abdominal wall hernia. Musculoskeletal: No acute fracture. Remote left pubic rami fractures. IMPRESSION: 1. Mild right hydroureteronephrosis upstream from a 8 mm stone in the distal right ureter. 2. Cholelithiasis without evidence of acute cholecystitis. 3. Colonic diverticulosis without diverticulitis. 4. Fat containing left ventral abdominal wall hernia. Aortic Atherosclerosis (ICD10-I70.0). Electronically Signed   By: Minerva Fester M.D.   On: 11/07/2023 23:21   DG Chest Port 1 View Result Date: 11/07/2023 CLINICAL DATA:  Possible sepsis EXAM: PORTABLE CHEST 1 VIEW COMPARISON:  05/28/2020 FINDINGS: Chronic elevation of right diaphragm. No focal opacity or pleural effusion. Normal cardiac size. No pneumothorax IMPRESSION: No active disease. Chronic elevation of right diaphragm. Electronically Signed   By: Jasmine Pang M.D.   On: 11/07/2023 23:20     Assessment and Plan:   Intermittent bradycardia  Cardiology consulted in patient admitted and found with right hydroureteronephrosis proximal to 8mm stone in distal R ureter. Urology consulted and patient transferred for emergent cystoscopy and R ureteral stenting. Patient underwent procedure on 12/17 and transferred to PACU where she was hypotensive. Intermittent bradycardia also noted post-procedure and cardiology consulted.  Suspect that lower HR earlier this admission secondary to post-procedure  autonomic dysregulation and/or increased vagal tone. Patient appears to have recovered rates at this time. Review of telemetry shows no evidence of heart block or pauses. Patient with resting HR today in the upper 50s-low 60s. Appropriate chronotropic compensation seen with HR increase on physical activity. When talking with me today, HR in the 70s. Given stable rhythm today and seemingly no symptoms suggestive of brady-arrhythmia both inpatient and at home, do not think outpatient heart monitor necessary.  Recommend holding Atenolol (taken for hypertension) indefinitely at discharge given low normal resting HR. If patient develops disproportionate fatigue or pre-syncope following discharge despite normal BP, would then recommend monitoring HR with Zio.    Risk Assessment/Risk Scores:                For questions or updates, please contact [facility] Please consult [email or web address] for contact info under    Signed, Perlie Gold, PA-C  11/10/2023 3:17 PM

## 2023-11-10 NOTE — Discharge Summary (Signed)
Physician Discharge Summary  Patient ID: Nicole [name] MRN: [id number] DOB/AGE: 1945-12-09 77 y.o.  Admit date: 11/07/2023 Discharge date: 11/10/2023  Admission Diagnoses: septic shock  Discharge Diagnoses:  Principal Problem:   Obstructive uropathy Active Problems:   Nephrolithiasis   Septic shock (HCC) Septic shock due to UTI AKI Obstructive nephrolithiasis Hyperglycemia DM type 2 Hypothyroidism Stage 1 pressure injury sacrum , POA   Discharged Condition: good  Hospital Course: Mrs. Nicole [name] was admitted to the hospital after undergoing cystoscopy with stent placement to relieve her urinary obstruction.  She remained in the ICU for the duration of her course due to requirement for vasopressors for 48 hours.  She was continued on ceftriaxone.  Urine cultures returned E. coli sensitive to cephalosporins.  She was discharged on a 2-week course of Keflex with planned outpatient follow-up with urology.  She was seen by cardiology for recurrent bradycardic episodes.  She is not continued on atenolol or other antihypertensives at discharge.  She can follow-up with PCP to determine if resuming antihypertensives as appropriate.  Per her daughter she had been taken off of them previously for hypotension.  Consults: Urology, Cardiology, PCCM   Significant Diagnostic Studies:   CT A/P: IMPRESSION: 1. Mild right hydroureteronephrosis upstream from a 8 mm stone in the distal right ureter. 2. Cholelithiasis without evidence of acute cholecystitis. 3. Colonic diverticulosis without diverticulitis. 4. Fat containing left ventral abdominal wall hernia.  Urine culture E. coli, intermediate to ampicillin, resistant to ciprofloxacin, otherwise sensitive     Latest Ref Rng & Units 11/10/2023    3:33 AM 11/09/2023   11:19 AM 11/08/2023    8:12 AM  BMP  Glucose 70 - 99 mg/dL 95  95  147   BUN 8 - 23 mg/dL 18  20  18    Creatinine 0.44 - 1.00 mg/dL 8.29  5.62  1.30   Sodium 135 - 145  mmol/L 138  140  139   Potassium 3.5 - 5.1 mmol/L 3.9  3.3  3.8   Chloride 98 - 111 mmol/L 107  107  108   CO2 22 - 32 mmol/L 24  26  22    Calcium 8.9 - 10.3 mg/dL 8.5  9.2  8.9        Latest Ref Rng & Units 11/10/2023    3:33 AM 11/09/2023   11:19 AM 11/08/2023    8:12 AM  CBC  WBC 4.0 - 10.5 K/uL 9.0  14.5  18.8   Hemoglobin 12.0 - 15.0 g/dL 86.5  78.4  69.6   Hematocrit 36.0 - 46.0 % 34.0  35.6  36.5   Platelets 150 - 400 K/uL 154  190  203      Treatments: ureteral stenting, antibiotics, vasopressors, IVF  Discharge Exam: Blood pressure 114/62, pulse (!) 59, temperature 98.1 F (36.7 C), temperature source Oral, resp. rate 20, height 5\' 3"  (1.6 m), weight 74.7 kg, SpO2 97%. Elderly woman sitting up in the recliner no acute distress [place]/AT, eyes anicteric More interactive today, moving all extremities S1-S2, regular rate and rhythm Breathing comfortably on room air, CTAB Abdomen soft, nontender, nondistended No peripheral edema, no clubbing or cyanosis No pallor, diffuse rashes.  Skin warm and dry Awake and alert, answering questions appropriately.  Disposition: Discharge disposition: 01-Home or Self Care       Discharge Instructions     Diet - low sodium heart healthy   Complete by: As directed    Diet Carb Modified   Complete by: As directed  Increase activity slowly   Complete by: As directed    No wound care   Complete by: As directed       Allergies as of 11/10/2023   No Known Allergies      Medication List     STOP taking these medications    atenolol 25 MG tablet Commonly known as: TENORMIN   furosemide 20 MG tablet Commonly known as: LASIX   lisinopril 10 MG tablet Commonly known as: ZESTRIL   spironolactone 25 MG tablet Commonly known as: ALDACTONE       TAKE these medications    aspirin EC 81 MG tablet Take 81 mg by mouth at bedtime. Swallow whole.   CALCIUM 600+D3 PO Take 1 tablet by mouth in the morning and at  bedtime.   cephALEXin 500 MG capsule Commonly known as: KEFLEX Take 1 capsule (500 mg total) by mouth every 6 (six) hours.   D3 50 MCG (2000 UT) Tabs Generic drug: Cholecalciferol Take 1 tablet by mouth daily.   levothyroxine 50 MCG tablet Commonly known as: SYNTHROID Take by mouth.   omeprazole 40 MG capsule Commonly known as: PRILOSEC Take 40 mg by mouth daily.   tiZANidine 4 MG tablet Commonly known as: ZANAFLEX Take 4 mg by mouth every 12 (twelve) hours as needed for muscle spasms.        Follow-up Information     [facility] Follow up.   Why: My office will call to schedule the next procedure for sometime in the next 1-3 weeks. Contact information: [address] [place] [address] [phone]                Discharge plan discussed with patient, husband, and daughter this morning.  All agree with plan.  Has outpatient follow-up with urology.   Signed: Steffanie Dunn 11/10/2023, 4:06 PM

## 2023-11-10 NOTE — Progress Notes (Signed)
[facility] ADULT ICU REPLACEMENT PROTOCOL   The patient does apply for the [facility] Adult ICU Electrolyte Replacment Protocol based on the criteria listed below:   1.Exclusion criteria: TCTS, ECMO, Dialysis, and Myasthenia Gravis patients 2. Is GFR >/= 30 ml/min? Yes.    Patient's GFR today is >60 3. Is SCr </= 2? Yes.   Patient's SCr is 0.79 mg/dL 4. Did SCr increase >/= 0.5 in 24 hours? No. 5.Pt's weight >40kg  Yes.   6. Abnormal electrolyte(s):   Mg 1.4  7. Electrolytes replaced per protocol 8.  Call MD STAT for K+ </= 2.5, Phos </= 1, or Mag </= 1 Physician:  Shawn Stall R Susanna Benge 11/10/2023 4:58 AM

## 2023-11-11 ENCOUNTER — Telehealth: Payer: Self-pay | Admitting: Cardiology

## 2023-11-11 ENCOUNTER — Other Ambulatory Visit: Payer: Self-pay | Admitting: Urology

## 2023-11-11 NOTE — Telephone Encounter (Signed)
   Pre-operative Risk Assessment    Patient Name: Nicole [name]  DOB: 02-05-1946 MRN: [id number]      Request for Surgical Clearance    Procedure:   Ureteroscopy  Date of Surgery:  Clearance 12/13/23                                 Surgeon:  Dr. Marshia Ly Group or Practice Name:  [facility] Phone number:  [phone] Ext. [phone] Fax number:  [phone]   Type of Clearance Requested:   - Medical  - Pharmacy:  Hold Aspirin 5 days prior to surgery   Type of Anesthesia:  General    Additional requests/questions:  Please fax a copy of medical clearance to the surgeon's office.  [name]   11/11/2023, 3:07 PM

## 2023-11-11 NOTE — Telephone Encounter (Signed)
   Name: Nicole [name]  DOB: 12/20/1945  MRN: [id number]  Primary Cardiologist: Armanda Magic, MD  Chart reviewed as part of pre-operative protocol coverage. The patient has an upcoming visit scheduled with Perlie Gold, PA on 12/08/23 at which time clearance can be addressed in case there are any issues that would impact surgical recommendations.  Ureteroscopy is not scheduled until 12/13/23 as below. I added preop FYI to appointment note so that provider is aware to address at time of outpatient visit.  Per office protocol the cardiology provider should forward their finalized clearance decision and recommendations regarding antiplatelet therapy to the requesting party below.    Aspirin 81 mg daily does not appear to have been prescribed by cardiology.  From a cardiac standpoint patient can hold aspirin 81 mg daily for 5 days prior to procedure however final recommendation regarding holding of aspirin should come from prescribing office.  I will route this message as FYI to requesting party and remove this message from the preop box as separate preop APP input not needed at this time.   Please call with any questions.  Rip Harbour, NP  11/11/2023, 3:23 PM

## 2023-11-28 NOTE — Progress Notes (Signed)
 PT called and [facility] on 11/28/23 in regards to pt had been in hospital and been taken off of blood pressure meds but blood pressure per pt has been trending upward and she has placed herself back on meds. PT wanted to know what to do in regards to surgery. P Reop will beon on 12/05/23.  Preop nurse instructed pt to call PCP who follows hher for blood pressure and to have an appt with him prior to surgery in regards to blood pressure meds and followup.  PT ovied understanding.  PT aware preop dept does not instruct in regars to the starting and stopping of meds.

## 2023-11-29 NOTE — Patient Instructions (Addendum)
 SURGICAL WAITING ROOM VISITATION  Patients having surgery or a procedure may have no more than 2 support people in the waiting area - these visitors may rotate.    Children under the age of 100 must have an adult with them who is not the patient.  Due to an increase in RSV and influenza rates and associated hospitalizations, children ages 71 and under may not visit patients in [facility] hospitals.  If the patient needs to stay at the hospital during part of their recovery, the visitor guidelines for inpatient rooms apply. Pre-op nurse will coordinate an appropriate time for 1 support person to accompany patient in pre-op.  This support person may not rotate.    Please refer to the [facility] website for the visitor guidelines for Inpatients (after your surgery is over and you are in a regular room).       Your procedure is scheduled on:  12/13/23    Report to [facility] Main Entrance    Report to admitting at   [phone]   Call this number if you have problems the morning of surgery [phone]   Do not eat food or liquids after midnite.                            If you have questions, please contact your surgeon's office.      Oral Hygiene is also important to reduce your risk of infection.                                    Remember - BRUSH YOUR TEETH THE MORNING OF SURGERY WITH YOUR REGULAR TOOTHPASTE  DENTURES WILL BE REMOVED PRIOR TO SURGERY PLEASE DO NOT APPLY Poly grip OR ADHESIVES!!!   Do NOT smoke after Midnight   Stop all vitamins and herbal supplements 7 days before surgery.   Take these medicines the morning of surgery with A SIP OF WATER :  synthroid , atenolol , omeprazole  DO NOT TAKE ANY ORAL DIABETIC MEDICATIONS DAY OF YOUR SURGERY  Bring CPAP mask and tubing day of surgery.                              You may not have any metal on your body including hair pins, jewelry, and body piercing             Do not wear make-up, lotions, powders,  perfumes/cologne, or deodorant  Do not wear nail polish including gel and S&S, artificial/acrylic nails, or any other type of covering on natural nails including finger and toenails. If you have artificial nails, gel coating, etc. that needs to be removed by a nail salon please have this removed prior to surgery or surgery may need to be canceled/ delayed if the surgeon/ anesthesia feels like they are unable to be safely monitored.   Do not shave  48 hours prior to surgery.               Men may shave face and neck.   Do not bring valuables to the hospital. [facility] IS NOT             RESPONSIBLE   FOR VALUABLES.   Contacts, glasses, dentures or bridgework may not be worn into surgery.   Bring small overnight bag day of surgery.   DO  NOT BRING YOUR HOME MEDICATIONS TO THE HOSPITAL. PHARMACY WILL DISPENSE MEDICATIONS LISTED ON YOUR MEDICATION LIST TO YOU DURING YOUR ADMISSION IN THE HOSPITAL!    Patients discharged on the day of surgery will not be allowed to drive home.  Someone NEEDS to stay with you for the first 24 hours after anesthesia.   Special Instructions: Bring a copy of your healthcare power of attorney and living will documents the day of surgery if you haven't scanned them before.              Please read over the following fact sheets you were given: IF YOU HAVE QUESTIONS ABOUT YOUR PRE-OP INSTRUCTIONS PLEASE CALL [phone]   If you received a COVID test during your pre-op visit  it is requested that you wear a mask when out in public, stay away from anyone that may not be feeling well and notify your surgeon if you develop symptoms. If you test positive for Covid or have been in contact with anyone that has tested positive in the last 10 days please notify you surgeon.    [facility] - Preparing for Surgery Before surgery, you can play an important role.  Because skin is not sterile, your skin needs to be as free of germs as possible.  You can reduce the number of  germs on your skin by washing with CHG (chlorahexidine gluconate) soap before surgery.  CHG is an antiseptic cleaner which kills germs and bonds with the skin to continue killing germs even after washing. Please DO NOT use if you have an allergy to CHG or antibacterial soaps.  If your skin becomes reddened/irritated stop using the CHG and inform your nurse when you arrive at Short Stay. Do not shave (including legs and underarms) for at least 48 hours prior to the first CHG shower.  You may shave your face/neck. Please follow these instructions carefully:  1.  Shower with CHG Soap the night before surgery and the  morning of Surgery.  2.  If you choose to wash your hair, wash your hair first as usual with your  normal  shampoo.  3.  After you shampoo, rinse your hair and body thoroughly to remove the  shampoo.                           4.  Use CHG as you would any other liquid soap.  You can apply chg directly  to the skin and wash                       Gently with a scrungie or clean washcloth.  5.  Apply the CHG Soap to your body ONLY FROM THE NECK DOWN.   Do not use on face/ open                           Wound or open sores. Avoid contact with eyes, ears mouth and genitals (private parts).                       Wash face,  Genitals (private parts) with your normal soap.             6.  Wash thoroughly, paying special attention to the area where your surgery  will be performed.  7.  Thoroughly rinse your body with warm water  from the neck down.  8.  DO NOT shower/wash with your normal soap after using and rinsing off  the CHG Soap.                9.  Pat yourself dry with a clean towel.            10.  Wear clean pajamas.            11.  Place clean sheets on your bed the night of your first shower and do not  sleep with pets. Day of Surgery : Do not apply any lotions/deodorants the morning of surgery.  Please wear clean clothes to the hospital/surgery center.  FAILURE TO FOLLOW THESE  INSTRUCTIONS MAY RESULT IN THE CANCELLATION OF YOUR SURGERY PATIENT SIGNATURE_________________________________  NURSE SIGNATURE__________________________________  ________________________________________________________________________

## 2023-11-29 NOTE — Progress Notes (Addendum)
 Anesthesia Review:  PCP: Josette Aho LOV 07/04/23  Cardiologist : Artist slider, PAC OV on 12/08/23  Chest x-ray : 11/07/23- 1 viw  EKG : 11/10/23  Echo : 2019  Stress test: Cardiac Cath :  Activity level: can do a flight of stairs without difficutly  Sleep Study/ CPAP : none  Fasting Blood Sugar :      / Checks Blood Sugar -- times a day:   Blood Thinner/ Instructions /Last Dose: ASA / Instructions/ Last Dose :    DM- type 2- diet controlled , no meds, checks glucose every 1-2 days per pt   Hgba1c-11/08/23- 6.0    11/08/23- cystoscopy

## 2023-12-05 ENCOUNTER — Other Ambulatory Visit: Payer: Self-pay

## 2023-12-05 ENCOUNTER — Encounter (HOSPITAL_COMMUNITY): Payer: Self-pay

## 2023-12-05 ENCOUNTER — Encounter (HOSPITAL_COMMUNITY)
Admission: RE | Admit: 2023-12-05 | Discharge: 2023-12-05 | Disposition: A | Discharge status: Home / Self Care | Payer: Medicare Other | Source: Ambulatory Visit | Attending: Urology | Admitting: Urology

## 2023-12-05 VITALS — BP 140/72 | HR 62 | Temp 98.6°F | Resp 16 | Ht 62.5 in | Wt 150.0 lb

## 2023-12-05 DIAGNOSIS — Z01818 Encounter for other preprocedural examination: Secondary | ICD-10-CM

## 2023-12-05 DIAGNOSIS — Z01812 Encounter for preprocedural laboratory examination: Secondary | ICD-10-CM | POA: Diagnosis not present

## 2023-12-05 HISTORY — DX: Hypothyroidism, unspecified: E03.9

## 2023-12-05 HISTORY — DX: Gastro-esophageal reflux disease without esophagitis: K21.9

## 2023-12-05 HISTORY — DX: Personal history of urinary calculi: Z87.442

## 2023-12-05 LAB — CBC
HCT: 42.9 % (ref 36.0–46.0)
Hemoglobin: 13.3 g/dL (ref 12.0–15.0)
MCH: 29.8 pg (ref 26.0–34.0)
MCHC: 31 g/dL (ref 30.0–36.0)
MCV: 96.2 fL (ref 80.0–100.0)
Platelets: 237 10*3/uL (ref 150–400)
RBC: 4.46 MIL/uL (ref 3.87–5.11)
RDW: 13.3 % (ref 11.5–15.5)
WBC: 8.3 10*3/uL (ref 4.0–10.5)
nRBC: 0 % (ref 0.0–0.2)

## 2023-12-05 LAB — BASIC METABOLIC PANEL
Anion gap: 9 (ref 5–15)
BUN: 17 mg/dL (ref 8–23)
CO2: 26 mmol/L (ref 22–32)
Calcium: 9.6 mg/dL (ref 8.9–10.3)
Chloride: 101 mmol/L (ref 98–111)
Creatinine, Ser: 0.62 mg/dL (ref 0.44–1.00)
GFR, Estimated: >60 mL/min (ref >60)
Glucose, Bld: 118 mg/dL — ABNORMAL HIGH (ref 70–99)
Potassium: 3.8 mmol/L (ref 3.5–5.1)
Sodium: 136 mmol/L (ref 135–145)

## 2023-12-05 LAB — GLUCOSE, CAPILLARY: Glucose-Capillary: 109 mg/dL — ABNORMAL HIGH (ref 70–99)

## 2023-12-08 ENCOUNTER — Ambulatory Visit: Payer: Medicare Other | Attending: Cardiology | Admitting: Cardiology

## 2023-12-08 ENCOUNTER — Encounter: Payer: Self-pay | Admitting: Cardiology

## 2023-12-08 VITALS — BP 130/78 | HR 59 | Ht 62.0 in | Wt 154.8 lb

## 2023-12-08 DIAGNOSIS — Z01810 Encounter for preprocedural cardiovascular examination: Secondary | ICD-10-CM | POA: Diagnosis present

## 2023-12-08 DIAGNOSIS — R001 Bradycardia, unspecified: Secondary | ICD-10-CM | POA: Diagnosis present

## 2023-12-08 DIAGNOSIS — I10 Essential (primary) hypertension: Secondary | ICD-10-CM | POA: Diagnosis not present

## 2023-12-08 NOTE — Progress Notes (Addendum)
Cardiology Office Note:   Date:  12/08/2023  ID:  ONAWA [name], DOB 1946-07-19, MRN [id number] PCP: Nicole [name]., PA-C  [facility] Providers Cardiologist:  Armanda Magic, MD    History of Present Illness:   Discussed the use of AI scribe software for clinical note transcription with the patient, who gave verbal consent to proceed.  History of Present Illness   The patient is a 78 year old individual with a history of hypertension, type two diabetes, gastroesophageal reflux disease, hyperparathyroidism, osteoarthritis, and bradycardia. She was recently admitted to the hospital due to right lower quadrant abdominal pain and nausea. Lab results revealed leukocytosis and elevated creatinine. Imaging showed hydroureter nephrosis proximal to an 8mm stone in the distal right ureter, cholelithiasis, colonic diverticulosis, and a left ventral abdominal wall hernia. The patient underwent an emergent cystoscopy and stenting procedure on December 17th. Post-procedure, she experienced hypotension and bradycardia, necessitating brief ICU admission and vasopressor therapy. Cardiology was consulted during this admission due to bradycardia. At the time of consultation, patient had regained stable rates with appropriate chronotropic response and no further workup recommended.  The patient presents for cardiac risk stratification prior to a scheduled uroscopy on January 21st. She reports that at the time of her hospital discharge, she was taken off all blood pressure medications due to the hypotensive episode. However, she noticed a trend of increasing blood pressure readings and made the decision to restart her medications, including Atenolol, Spironolactone, and Lisinopril. She has been monitoring her HR and blood pressure at home, which has been running in the 60s to low 70s, and her morning BP readings are around 119 over high 60s to low 70s.  The patient denies experiencing any shortness of  breath, chest discomfort, dizziness, lightheadedness, or swelling in the legs. She also denies any issues with sleeping or any blood in her urine. She has been active and able to perform her daily activities without any significant issues.     Today patient denies chest pain, shortness of breath, lower extremity edema, fatigue, palpitations, melena, hematuria, hemoptysis, diaphoresis, weakness, presyncope, syncope, orthopnea, and PND.   Studies Reviewed:    EKG:   EKG Interpretation Date/Time:  Thursday December 08 2023 08:32:11 EST Ventricular Rate:  59 PR Interval:  236 QRS Duration:  106 QT Interval:  404 QTC Calculation: 399 R Axis:   -46  Text Interpretation: Sinus bradycardia with 1st degree A-V block Left anterior fascicular block Moderate voltage criteria for LVH, may be normal variant ( R in aVL , Cornell product ) Septal infarct , age undetermined When compared with ECG of 10-Nov-2023 10:57, Premature ventricular complexes are no longer Present PR interval has increased Confirmed by Perlie Gold [phone]) on 12/08/2023 8:42:40 AM    Risk Assessment/Calculations:              Physical Exam:   VS:  BP 130/78 (BP Location: Left Arm, Patient Position: Sitting)   Pulse (!) 59   Ht 5\' 2"  (1.575 m)   Wt 154 lb 12.8 oz (70.2 kg)   SpO2 98%   BMI 28.31 kg/m    Wt Readings from Last 3 Encounters:  12/08/23 154 lb 12.8 oz (70.2 kg)  12/05/23 150 lb (68 kg)  11/10/23 164 lb 10.9 oz (74.7 kg)     Physical Exam Vitals reviewed.  Constitutional:      Appearance: Normal appearance.  HENT:     Head: Normocephalic.     Nose: Nose normal.  Eyes:  Pupils: Pupils are equal, round, and reactive to light.  Cardiovascular:     Rate and Rhythm: Normal rate and regular rhythm.     Pulses: Normal pulses.     Heart sounds: Normal heart sounds. No murmur heard.    No friction rub. No gallop.  Pulmonary:     Effort: Pulmonary effort is normal.     Breath sounds: Normal breath  sounds.  Abdominal:     General: Abdomen is flat.  Musculoskeletal:     Right lower leg: No edema.     Left lower leg: No edema.  Skin:    General: Skin is warm and dry.     Capillary Refill: Capillary refill takes less than 2 seconds.  Neurological:     General: No focal deficit present.     Mental Status: She is alert and oriented to person, place, and time.  Psychiatric:        Mood and Affect: Mood normal.        Behavior: Behavior normal.        Thought Content: Thought content normal.        Judgment: Judgment normal.     ASSESSMENT AND PLAN:     Assessment and Plan    Cardiac Risk Stratification 78 year old with hypertension, type 2 diabetes, GERD, hyperparathyroidism, osteoarthritis, and bradycardia. Presents for cardiac risk stratification prior to uroscopy on January 21. Recent hypotension and bradycardia post-emergent cystoscopy and stenting. Current heart rate in the 50s to 60s with appropriate chronotropic compensation. EKG today shows first-degree heart block, a benign finding. Blood pressure and heart rate are within normal range. No symptoms of chest pain, dyspnea, dizziness, or edema. High functional capacity. Quantitative perioperative risk of major cardiac complications is 0.4%, indicating very low risk. Discussed that low heart rate and blood pressure post-procedure were likely due to sensitivity to sedation and anesthesia with increased vagal tone rather than cardiac issues. Informed that if symptoms of dizziness, lightheadedness, fatigue, or continuously low heart rate occur, reassessment of atenolol will be necessary.Pre-op labs reviewed, all within expected ranges. - Therefore, based on ACC/AHA guidelines, patient would be at acceptable risk for the planned procedure without further cardiovascular testing. I will route this recommendation to the requesting party via Epic fax function. - Advise to continue atenolol, spironolactone, and lisinopril as per preop  instructions.  - Monitor for symptoms of dizziness, lightheadedness, fatigue, or low heart rate and reassess atenolol if these occur.   Bradycardia Bradycardia noted post-procedure, currently with heart rate in the 50s to 60s. EKG today shows first-degree heart block, which is benign. No changes to current management unless symptoms develop. Suspect that lower HR earlier during her admission secondary to post-procedure autonomic dysregulation and/or increased vagal tone.  - Monitor for symptoms and reassess if necessary  Hypertension Hypertension managed with atenolol, spironolactone, and lisinopril. Blood pressure initially trended up after discontinuation of medications post-hospitalization but stabilized after resuming medications. Current readings are within normal range. Discussed the importance of continuing the antihypertensive regimen and monitoring blood pressure regularly. - Continue current antihypertensive regimen - Monitor blood pressure regularly  General Health Maintenance Active and performing daily activities without issues. Recent blood work is normal. No new symptoms reported. - Continue regular follow-ups with primary care - Maintain current activity level and lifestyle  Follow-up - As needed follow-up with cardiology - Follow up with primary care as needed.             Signed, Perlie Gold, PA-C

## 2023-12-08 NOTE — Patient Instructions (Signed)
Medication Instructions:   Your physician recommends that you continue on your current medications as directed. Please refer to the Current Medication list given to you today.   *If you need a refill on your cardiac medications before your next appointment, please call your pharmacy*   Lab Work:  NONE ORDERED  TODAY   If you have labs (blood work) drawn today and your tests are completely normal, you will receive your results only by: MyChart Message (if you have MyChart) OR A paper copy in the mail If you have any lab test that is abnormal or we need to change your treatment, we will call you to review the results.   Testing/Procedures: NONE ORDERED  TODAY       Follow-Up: At [facility], you and your health needs are our priority.  As part of our continuing mission to provide you with exceptional heart care, we have created designated Provider Care Teams.  These Care Teams include your primary Cardiologist (physician) and Advanced Practice Providers (APPs -  Physician Assistants and Nurse Practitioners) who all work together to provide you with the care you need, when you need it.  We recommend signing up for the patient portal called "MyChart".  Sign up information is provided on this After Visit Summary.  MyChart is used to connect with patients for Virtual Visits (Telemedicine).  Patients are able to view lab/test results, encounter notes, upcoming appointments, etc.  Non-urgent messages can be sent to your provider as well.   To learn more about what you can do with MyChart, go to [email or web address].    Your next appointment:  CONTACT [facility] [phone] AS NEEDED FOR  ANY CARDIAC RELATED SYMPTOMS  If Card or EP not listed click to update   DO NOT delete brackets or number around this link :1}   Other Instructions

## 2023-12-12 ENCOUNTER — Encounter (HOSPITAL_COMMUNITY): Payer: Self-pay

## 2023-12-12 NOTE — H&P (Signed)
Patient ID: Nicole [name], female   DOB: Aug 06, 1946, 78 y.o.   MRN: [id number]    Subjective: Nicole [name] had hypotension post operatively and is now on low dose pressors but is feeling well with improved BP and resolution of her pain.   She is to be admitted to the ICU.  ROS:  Review of Systems  All other systems reviewed and are negative.   Anti-infectives: Anti-infectives (From admission, onward)    None       No current facility-administered medications for this encounter.   Current Outpatient Medications  Medication Sig Dispense Refill   aspirin EC 81 MG tablet Take 81 mg by mouth at bedtime. Swallow whole.     atenolol (TENORMIN) 25 MG tablet Take 25 mg by mouth daily.     Calcium Carb-Cholecalciferol (CALCIUM 600+D3 PO) Take 1 tablet by mouth in the morning and at bedtime.     Cholecalciferol (D3) 50 MCG (2000 UT) TABS Take 2,000 Units by mouth daily.     levothyroxine (SYNTHROID) 50 MCG tablet Take 50 mcg by mouth daily before breakfast.     lisinopril (ZESTRIL) 10 MG tablet Take 1 tablet by mouth daily.     omeprazole (PRILOSEC) 40 MG capsule Take 40 mg by mouth daily.     OVER THE COUNTER MEDICATION Take 4 capsules by mouth daily. Nutrafol     spironolactone (ALDACTONE) 25 MG tablet Take 25 mg by mouth daily.     cephALEXin (KEFLEX) 500 MG capsule Take 1 capsule (500 mg total) by mouth every 6 (six) hours. (Patient not taking: Reported on 11/29/2023) 42 capsule 0     Objective: Vital signs in last 24 hours:    Intake/Output from previous day: No intake/output data recorded. Intake/Output this shift: No intake/output data recorded.   Physical Exam Vitals reviewed.  Constitutional:      Appearance: Normal appearance.  Genitourinary:    Comments: Urine slightly bloody in foley tube.  Neurological:     Mental Status: She is alert.     Lab Results:  No results for input(s): "WBC", "HGB", "HCT", "PLT" in the last 72 hours.  BMET No results for input(s):  "NA", "K", "CL", "CO2", "GLUCOSE", "BUN", "CREATININE", "CALCIUM" in the last 72 hours.  PT/INR No results for input(s): "LABPROT", "INR" in the last 72 hours. ABG No results for input(s): "PHART", "HCO3" in the last 72 hours.  Invalid input(s): "PCO2", "PO2"  Studies/Results: No results found.    Assessment and Plan: Right distal ureteral stone with sepsis.   Stent and foley in place.  Post op hypotension improving.   I will get her scheduled for ureteroscopy in 1-3 weeks.       LOS: 0 days    Bjorn Pippin 12/12/2023

## 2023-12-12 NOTE — Progress Notes (Signed)
DISCUSSION: Nicole [name] is a 78 year old female who presents to PAT prior to CYSTOSCOPY/RIGHT URETEROSCOPY/HOLMIUM LASER/STENT PLACEMENT on 12/13/2023 with Dr. Annabell Howells.  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  Patient was admitted 12/16-12/19 due to urosepsis with septic shock secondary to kidney stones.  She underwent cystoscopy with stent placement on 12/17.  Hospital course complicated by hypotension after the OR requiring vasopressors and bradycardia.  When she was discharged her blood pressure medicines were discontinued.  Bradycardia was thought to be due to "post-procedure autonomic dysregulation and/or increased vagal tone".  It improved on its own prior to discharge and no further workup was recommended.  Patient saw cardiology for preop clearance on 12/08/2023.  The patient had restarted her blood pressure medicines on her own.  Blood pressure has been controlled with no hypotensive episodes and patient denied any symptoms.  She was cleared for surgery:  "No symptoms of chest pain, dyspnea, dizziness, or edema. High functional capacity. Quantitative perioperative risk of major cardiac complications is 0.4%, indicating very low risk. Discussed that low heart rate and blood pressure post-procedure were likely due to sensitivity to sedation and anesthesia with increased vagal tone rather than cardiac issues. Informed that if symptoms of dizziness, lightheadedness, fatigue, or continuously low heart rate occur, reassessment of atenolol will be necessary.Pre-op labs reviewed, all within expected ranges. - Therefore, based on ACC/AHA guidelines, patient would be at acceptable risk for the planned procedure without further cardiovascular testing. I will route this recommendation to the requesting party via Epic fax function. - Advise to continue atenolol, spironolactone, and lisinopril as  per preop instructions.  - Monitor for symptoms of dizziness, lightheadedness, fatigue, or low heart rate and reassess atenolol if these occur"  Patient has previously been evaluated for chronic elevation of her right diaphragm by pulmonology in 2019. She has been asymptomatic from this.  She was cleared for back surgery at that time without any further workup as she is asymptomatic and imaging did not reveal any findings.  VS: BP (!) 140/72   Pulse 62   Temp 37 C (Oral)   Resp 16   Ht 5' 2.5" (1.588 m)   Wt 68 kg   SpO2 97%   BMI 27.00 kg/m   PROVIDERS: Richmond Campbell., PA-C Cardiologist:  Armanda Magic, MD    LABS: Labs reviewed: Acceptable for surgery. (all labs ordered are listed, but only abnormal results are displayed)  Labs Reviewed  BASIC METABOLIC PANEL - Abnormal; Notable for the following components:      Result Value   Glucose, Bld 118 (*)    All other components within normal limits  GLUCOSE, CAPILLARY - Abnormal; Notable for the following components:   Glucose-Capillary 109 (*)    All other components within normal limits  CBC     IMAGES:  Chest x-ray 11/07/2023: FINDINGS: Chronic elevation of right diaphragm. No focal opacity or pleural effusion. Normal cardiac size. No pneumothorax   IMPRESSION: No active disease. Chronic elevation of right diaphragm.  EKG: 12/08/2023:  Sinus bradycardia with 1st degree A-V block, rate 59 Left anterior fascicular block Moderate voltage criteria for LVH, may be normal variant ( R in aVL , Cornell product ) Septal infarct , age undetermined When compared with ECG of 10-Nov-2023 10:57, Premature ventricular complexes are no longer Present PR interval has increased  CV:  Echo 08/21/2018:  Study Conclusions  - Left ventricle: The cavity size was normal.  There was moderate   focal basal and mild concentric hypertrophy of the septum.   Systolic function was normal. The estimated ejection fraction was   in the  range of 55% to 60%. Although no diagnostic regional wall   motion abnormality was identified, this possibility cannot be   completely excluded on the basis of this study due to   foreshortening of images in several views. Doppler parameters are   consistent with abnormal left ventricular relaxation (grade 1   diastolic dysfunction). - Aortic valve: There was mild to moderate regurgitation. 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 - Mitral valve: There was trivial regurgitation. - Right ventricle: Systolic function was normal. - Tricuspid valve: There was mild regurgitation. - Pulmonic valve: There was no significant regurgitation. - Pulmonary arteries: Systolic pressure was mildly increased. PA   peak pressure: 34 mm Hg (S).  Past Medical History:  Diagnosis Date   Diabetes mellitus    dx 5 - 6 yrs ago.   GERD (gastroesophageal reflux disease)    History of kidney stones    Hypertension    Hypothyroidism    Primary localized osteoarthritis of right knee 03/08/2018    Past Surgical History:  Procedure Laterality Date   ABDOMINAL HYSTERECTOMY     CYSTOSCOPY W/ URETERAL STENT PLACEMENT Right 11/08/2023   Procedure: 1.  Cystoscopy with right retrograde pyelogram and interpretation. 2.  Cystoscopy with insertion of right double-J stent. 3.  Application of fluoroscopy.;  Surgeon: Bjorn Pippin, MD;  Location: [facility] OR;  Service: Urology;  Laterality: Right;   PARATHYROIDECTOMY Right 06/06/2020   Procedure: RIGHT PARATHYROIDECTOMY;  Surgeon: Darnell Level, MD;  Location: [facility] ORS;  Service: General;  Laterality: Right;   THYROID LOBECTOMY Right 06/06/2020   Procedure: RIGHT THYROID LOBECTOMY;  Surgeon: Darnell Level, MD;  Location: [facility] ORS;  Service: General;  Laterality: Right;   TOTAL KNEE ARTHROPLASTY Right 03/20/2018   Procedure: RIGHT TOTAL KNEE ARTHROPLASTY;  Surgeon: Salvatore Marvel, MD;  Location: [facility] OR;  Service: Orthopedics;   Laterality: Right;    MEDICATIONS:  aspirin EC 81 MG tablet   atenolol (TENORMIN) 25 MG tablet   Calcium Carb-Cholecalciferol (CALCIUM 600+D3 PO)   cephALEXin (KEFLEX) 500 MG capsule   Cholecalciferol (D3) 50 MCG (2000 UT) TABS   levothyroxine (SYNTHROID) 50 MCG tablet   lisinopril (ZESTRIL) 10 MG tablet   omeprazole (PRILOSEC) 40 MG capsule   OVER THE COUNTER MEDICATION   spironolactone (ALDACTONE) 25 MG tablet   No current facility-administered medications for this encounter.   Marcille Blanco [facility] Short Stay/Anesthesiology [facility] Phone [phone] 12/12/2023 8:37 AM

## 2023-12-12 NOTE — Anesthesia Preprocedure Evaluation (Signed)
Anesthesia Evaluation  Patient identified by MRN, date of birth, ID band Patient awake    Reviewed: Allergy & Precautions, NPO status , Patient's Chart, lab work & pertinent test results  Airway Mallampati: I  TM Distance: >3 FB Neck ROM: Full    Dental  (+) Teeth Intact, Dental Advisory Given   Pulmonary neg pulmonary ROS   breath sounds clear to auscultation       Cardiovascular hypertension, Pt. on home beta blockers and Pt. on medications  Rhythm:Regular Rate:Normal     Neuro/Psych negative neurological ROS  negative psych ROS   GI/Hepatic Neg liver ROS,GERD  Medicated,,  Endo/Other  diabetesHypothyroidism    Renal/GU Renal disease     Musculoskeletal  (+) Arthritis ,    Abdominal   Peds  Hematology negative hematology ROS (+)   Anesthesia Other Findings   Reproductive/Obstetrics                             Anesthesia Physical Anesthesia Plan  ASA: 2  Anesthesia Plan: General   Post-op Pain Management: Tylenol PO (pre-op)* and Toradol IV (intra-op)*   Induction: Intravenous  PONV Risk Score and Plan: 4 or greater and Ondansetron, Dexamethasone and Treatment may vary due to age or medical condition  Airway Management Planned: LMA  Additional Equipment: None  Intra-op Plan:   Post-operative Plan: Extubation in OR  Informed Consent: I have reviewed the patients History and Physical, chart, labs and discussed the procedure including the risks, benefits and alternatives for the proposed anesthesia with the patient or authorized representative who has indicated his/her understanding and acceptance.     Dental advisory given  Plan Discussed with: CRNA  Anesthesia Plan Comments: (See PAT note from 1/13 by Sherlie Ban PA-C )        Anesthesia Quick Evaluation

## 2023-12-13 ENCOUNTER — Encounter (HOSPITAL_COMMUNITY): Payer: Self-pay | Admitting: Urology

## 2023-12-13 ENCOUNTER — Encounter (HOSPITAL_COMMUNITY)
Admission: RE | Disposition: A | Discharge status: Home / Self Care | Payer: Self-pay | Source: Home / Self Care | Attending: Urology

## 2023-12-13 ENCOUNTER — Ambulatory Visit (HOSPITAL_COMMUNITY): Payer: Self-pay | Admitting: Medical

## 2023-12-13 ENCOUNTER — Ambulatory Visit (HOSPITAL_COMMUNITY)
Admission: RE | Admit: 2023-12-13 | Discharge: 2023-12-13 | Disposition: A | Discharge status: Home / Self Care | Payer: Medicare Other | Attending: Urology | Admitting: Urology

## 2023-12-13 ENCOUNTER — Ambulatory Visit (HOSPITAL_BASED_OUTPATIENT_CLINIC_OR_DEPARTMENT_OTHER): Payer: Medicare Other | Admitting: Certified Registered"

## 2023-12-13 ENCOUNTER — Ambulatory Visit (HOSPITAL_COMMUNITY): Payer: Medicare Other

## 2023-12-13 DIAGNOSIS — I10 Essential (primary) hypertension: Secondary | ICD-10-CM | POA: Insufficient documentation

## 2023-12-13 DIAGNOSIS — Z7982 Long term (current) use of aspirin: Secondary | ICD-10-CM | POA: Insufficient documentation

## 2023-12-13 DIAGNOSIS — Z79890 Hormone replacement therapy (postmenopausal): Secondary | ICD-10-CM | POA: Insufficient documentation

## 2023-12-13 DIAGNOSIS — Z79899 Other long term (current) drug therapy: Secondary | ICD-10-CM | POA: Diagnosis not present

## 2023-12-13 DIAGNOSIS — Z01818 Encounter for other preprocedural examination: Secondary | ICD-10-CM

## 2023-12-13 DIAGNOSIS — M1990 Unspecified osteoarthritis, unspecified site: Secondary | ICD-10-CM | POA: Insufficient documentation

## 2023-12-13 DIAGNOSIS — E039 Hypothyroidism, unspecified: Secondary | ICD-10-CM | POA: Insufficient documentation

## 2023-12-13 DIAGNOSIS — E119 Type 2 diabetes mellitus without complications: Secondary | ICD-10-CM | POA: Insufficient documentation

## 2023-12-13 DIAGNOSIS — N201 Calculus of ureter: Secondary | ICD-10-CM

## 2023-12-13 DIAGNOSIS — K219 Gastro-esophageal reflux disease without esophagitis: Secondary | ICD-10-CM | POA: Diagnosis not present

## 2023-12-13 HISTORY — PX: CYSTOSCOPY/URETEROSCOPY/HOLMIUM LASER/STENT PLACEMENT: SHX6546

## 2023-12-13 LAB — GLUCOSE, CAPILLARY: Glucose-Capillary: 93 mg/dL (ref 70–99)

## 2023-12-13 SURGERY — CYSTOSCOPY/URETEROSCOPY/HOLMIUM LASER/STENT PLACEMENT
Anesthesia: General | Laterality: Right

## 2023-12-13 MED ORDER — ACETAMINOPHEN 160 MG/5ML PO SOLN: 325.0000 mg | ORAL | Status: DC | PRN

## 2023-12-13 MED ORDER — ACETAMINOPHEN 10 MG/ML IV SOLN: 1000.0000 mg | Freq: Once | INTRAVENOUS | Status: DC | PRN

## 2023-12-13 MED ORDER — CHLORHEXIDINE GLUCONATE 0.12 % MT SOLN
15.0000 mL | Freq: Once | OROMUCOSAL | Status: AC
  Administered 2023-12-13: 15 mL via OROMUCOSAL

## 2023-12-13 MED ORDER — ONDANSETRON HCL 4 MG/2ML IJ SOLN
INTRAMUSCULAR | Status: AC
Start: 2023-12-13 — End: ?
  Filled 2023-12-13: qty 2

## 2023-12-13 MED ORDER — PHENYLEPHRINE 80 MCG/ML (10ML) SYRINGE FOR IV PUSH (FOR BLOOD PRESSURE SUPPORT)
PREFILLED_SYRINGE | INTRAVENOUS | Status: AC
  Filled 2023-12-13: qty 10

## 2023-12-13 MED ORDER — HYDROCODONE-ACETAMINOPHEN 5-325 MG PO TABS: 1.0000 | ORAL_TABLET | Freq: Four times a day (QID) | ORAL | 0 refills | Status: AC | PRN

## 2023-12-13 MED ORDER — DEXAMETHASONE SODIUM PHOSPHATE 10 MG/ML IJ SOLN
INTRAMUSCULAR | Status: DC | PRN
  Administered 2023-12-13: 8 mg via INTRAVENOUS

## 2023-12-13 MED ORDER — SODIUM CHLORIDE 0.9% FLUSH: 3.0000 mL | Freq: Two times a day (BID) | INTRAVENOUS | Status: DC

## 2023-12-13 MED ORDER — PROPOFOL 10 MG/ML IV BOLUS
INTRAVENOUS | Status: DC | PRN
  Administered 2023-12-13: 160 mg via INTRAVENOUS

## 2023-12-13 MED ORDER — SODIUM CHLORIDE 0.9 % IV SOLN
2.0000 g | INTRAVENOUS | Status: AC
  Administered 2023-12-13: 2 g via INTRAVENOUS
  Filled 2023-12-13: qty 20

## 2023-12-13 MED ORDER — SODIUM CHLORIDE 0.9 % IR SOLN
Status: DC | PRN
  Administered 2023-12-13: 3000 mL via INTRAVESICAL

## 2023-12-13 MED ORDER — OXYCODONE HCL 5 MG PO TABS: 5.0000 mg | ORAL_TABLET | Freq: Once | ORAL | Status: DC | PRN

## 2023-12-13 MED ORDER — PHENYLEPHRINE 80 MCG/ML (10ML) SYRINGE FOR IV PUSH (FOR BLOOD PRESSURE SUPPORT)
PREFILLED_SYRINGE | INTRAVENOUS | Status: DC | PRN
  Administered 2023-12-13 (×4): 80 ug via INTRAVENOUS

## 2023-12-13 MED ORDER — LACTATED RINGERS IV SOLN: INTRAVENOUS | Status: DC

## 2023-12-13 MED ORDER — FENTANYL CITRATE PF 50 MCG/ML IJ SOSY: 25.0000 ug | PREFILLED_SYRINGE | INTRAMUSCULAR | Status: DC | PRN

## 2023-12-13 MED ORDER — CEPHALEXIN 500 MG PO CAPS: 500.0000 mg | ORAL_CAPSULE | Freq: Three times a day (TID) | ORAL | 0 refills | Status: AC

## 2023-12-13 MED ORDER — LIDOCAINE HCL (PF) 2 % IJ SOLN
INTRAMUSCULAR | Status: AC
Start: 2023-12-13 — End: ?
  Filled 2023-12-13: qty 5

## 2023-12-13 MED ORDER — ONDANSETRON HCL 4 MG/2ML IJ SOLN
INTRAMUSCULAR | Status: DC | PRN
  Administered 2023-12-13: 4 mg via INTRAVENOUS

## 2023-12-13 MED ORDER — FENTANYL CITRATE (PF) 100 MCG/2ML IJ SOLN
INTRAMUSCULAR | Status: AC
  Filled 2023-12-13: qty 2

## 2023-12-13 MED ORDER — ACETAMINOPHEN 325 MG PO TABS: 325.0000 mg | ORAL_TABLET | ORAL | Status: DC | PRN

## 2023-12-13 MED ORDER — DROPERIDOL 2.5 MG/ML IJ SOLN: 0.6250 mg | Freq: Once | INTRAMUSCULAR | Status: DC | PRN

## 2023-12-13 MED ORDER — LIDOCAINE 2% (20 MG/ML) 5 ML SYRINGE
INTRAMUSCULAR | Status: DC | PRN
  Administered 2023-12-13: 80 mg via INTRAVENOUS

## 2023-12-13 MED ORDER — ORAL CARE MOUTH RINSE: 15.0000 mL | Freq: Once | OROMUCOSAL | Status: AC

## 2023-12-13 MED ORDER — DEXMEDETOMIDINE HCL IN NACL 80 MCG/20ML IV SOLN
INTRAVENOUS | Status: AC
Start: 2023-12-13 — End: ?
  Filled 2023-12-13: qty 20

## 2023-12-13 MED ORDER — PROPOFOL 10 MG/ML IV BOLUS
INTRAVENOUS | Status: AC
Start: 2023-12-13 — End: ?
  Filled 2023-12-13: qty 20

## 2023-12-13 MED ORDER — OXYCODONE HCL 5 MG/5ML PO SOLN: 5.0000 mg | Freq: Once | ORAL | Status: DC | PRN

## 2023-12-13 MED ORDER — DEXAMETHASONE SODIUM PHOSPHATE 10 MG/ML IJ SOLN
INTRAMUSCULAR | Status: AC
Start: 2023-12-13 — End: ?
  Filled 2023-12-13: qty 1

## 2023-12-13 MED ORDER — FENTANYL CITRATE (PF) 100 MCG/2ML IJ SOLN
INTRAMUSCULAR | Status: DC | PRN
  Administered 2023-12-13 (×2): 25 ug via INTRAVENOUS
  Administered 2023-12-13: 50 ug via INTRAVENOUS

## 2023-12-13 SURGICAL SUPPLY — 21 items
BAG URO CATCHER STRL LF (MISCELLANEOUS) ×1 IMPLANT
BASKET STONE NCOMPASS (UROLOGICAL SUPPLIES) IMPLANT
CATH URETERAL DUAL LUMEN 10F (MISCELLANEOUS) IMPLANT
CATH URETL OPEN 5X70 (CATHETERS) IMPLANT
CLOTH BEACON ORANGE TIMEOUT ST (SAFETY) ×1 IMPLANT
EXTRACTOR STONE NITINOL NGAGE (UROLOGICAL SUPPLIES) IMPLANT
GLOVE SURG SS PI 8.0 STRL IVOR (GLOVE) ×1 IMPLANT
GOWN SPEC L4 XLG W/TWL (GOWN DISPOSABLE) ×1 IMPLANT
GUIDEWIRE STR DUAL SENSOR (WIRE) ×1 IMPLANT
IV NS IRRIG 3000ML ARTHROMATIC (IV SOLUTION) ×1 IMPLANT
KIT TURNOVER KIT A (KITS) IMPLANT
LASER FIB FLEXIVA PULSE ID 365 (Laser) IMPLANT
LASER FIB FLEXIVA PULSE ID 550 (Laser) IMPLANT
LASER FIB FLEXIVA PULSE ID 910 (Laser) IMPLANT
MANIFOLD NEPTUNE II (INSTRUMENTS) ×1 IMPLANT
PACK CYSTO (CUSTOM PROCEDURE TRAY) ×1 IMPLANT
SHEATH NAVIGATOR HD 11/13X36 (SHEATH) IMPLANT
STENT URET 6FRX24 CONTOUR (STENTS) IMPLANT
TRACTIP FLEXIVA PULS ID 200XHI (Laser) IMPLANT
TUBING CONNECTING 10 (TUBING) ×1 IMPLANT
TUBING UROLOGY SET (TUBING) ×1 IMPLANT

## 2023-12-13 NOTE — Discharge Instructions (Addendum)
You may remove the stent on Friday morning by pulling the attached string that is tucked vaginally.  If you don't feel you can do this, please call the office for removal.

## 2023-12-13 NOTE — Op Note (Signed)
Procedure: 1.  Cystoscopy with right ureteroscopy, laser lithotripsy, stone extraction and exchange of double-J stent. 2.  Application of fluoroscopy.  Preop diagnosis: Right distal ureteral stone.  Postop diagnosis: Right mid ureteral stone.  Surgeon: Dr. Bjorn Pippin.  Anesthesia: General.  Specimen: Stone fragments.  Drains: 6 French by 24 cm right contour double-J stent with tether.  EBL: None.  Complications none.  Indications: The patient is a 78 year old female who originally presented with an obstructing 8 mm left distal ureteral stone and sepsis and she underwent stenting.  She returns now for ureteroscopy.  Procedure: She was taken operating room where she was given Rocephin.  A general anesthetic was induced.  She was placed in lithotomy position and fitted with PAS hose.  She was then prepped with Betadine solution and draped in usual sterile fashion.  Cystoscopy was performed using the 21 [place] scope and 30 degree lens.  Examination revealed a normal urethra.  Bladder wall was smooth and pale.  The left ureteral orifice was unremarkable.  The right ureteral orifice had a stent loop in place with some ureteral erythema and mild edema.  The stent loop was slightly encrusted.  Urine was slightly turbid.  The stent was grasped with a grasping forceps and pulled to the urethral meatus.  A sensor wire was then advanced the kidney under fluoroscopic guidance and the stent was removed.  A 6.5 French short semirigid ureteroscope was then inserted alongside the wire and passed up to the stone which was located in the upper mid ureter.  The stone could be seen easily with ureteroscope but also fluoroscopically.  The stone was then fragmented using the Moses laser and the 365 m fiber on the dusting setting.  Both the 0.3 J and 60 Hz settings and the 0.8 J and 10 Hz setting were used to fragment the stone.  The fragments were then removed using an engage basket.  Once all of the ureteral  fragments had been removed, the semirigid scope was replaced with the single-lumen flexible scope because on CT there was a calcification in the lower part of the kidney.  I was able to advance that scope to the kidney but despite thorough inspection of all calyces I was unable to find a retrievable stone.  A 6 French by 24 cm contour double-J stent was then advanced over the wire under fluoroscopic guidance.  The wire was removed, leaving good coil in the kidney and a good coil in the bladder.  The stent string was then left exiting urethra.  The cystoscope was then replaced and inspection confirmed good distal stent loop placement.  The bladder was drained and the cystoscope was removed.  The string was tied close to the meatus and trimmed to appropriate length before being tucked vaginally.  She was taken down from lithotomy position, her anesthetic was reversed and she was moved to recovery in stable condition.  There were no complications.

## 2023-12-13 NOTE — Anesthesia Procedure Notes (Signed)
Procedure Name: LMA Insertion Date/Time: 12/13/2023 7:36 AM  Performed by: Kinisha Soper D, CRNAPre-anesthesia Checklist: Patient identified, Emergency Drugs available, Suction available and Patient being monitored Patient Re-evaluated:Patient Re-evaluated prior to induction Oxygen Delivery Method: Circle system utilized Preoxygenation: Pre-oxygenation with 100% oxygen Induction Type: IV induction Ventilation: Mask ventilation without difficulty LMA: LMA inserted LMA Size: 4.0 Tube type: Oral Number of attempts: 1 Placement Confirmation: positive ETCO2 and breath sounds checked- equal and bilateral Tube secured with: Tape Dental Injury: Teeth and Oropharynx as per pre-operative assessment

## 2023-12-13 NOTE — Transfer of Care (Signed)
Immediate Anesthesia Transfer of Care Note  Patient: Nicole [name]  Procedure(s) Performed: CYSTOSCOPY/RIGHT URETEROSCOPY/HOLMIUM LASER/STENT EXCHANGE (Right)  Patient Location: PACU  Anesthesia Type:General  Level of Consciousness: awake, alert , and oriented  Airway & Oxygen Therapy: Patient Spontanous Breathing and Patient connected to face mask oxygen  Post-op Assessment: Report given to RN and Post -op Vital signs reviewed and stable  Post vital signs: Reviewed and stable  Last Vitals:  Vitals Value Taken Time  BP 136/81 12/13/23 0824  Temp    Pulse 66 12/13/23 0826  Resp 17 12/13/23 0826  SpO2 100 % 12/13/23 0826  Vitals shown include unfiled device data.  Last Pain:  Vitals:   12/13/23 0612  TempSrc: Oral  PainSc:       Patients Stated Pain Goal: 4 (12/13/23 0558)  Complications: No notable events documented.

## 2023-12-13 NOTE — Anesthesia Postprocedure Evaluation (Signed)
Anesthesia Post Note  Patient: Delisha Patil Beaupre  Procedure(s) Performed: CYSTOSCOPY/RIGHT URETEROSCOPY/HOLMIUM LASER/STENT EXCHANGE (Right)     Patient location during evaluation: PACU Anesthesia Type: General Level of consciousness: awake and alert Pain management: pain level controlled Vital Signs Assessment: post-procedure vital signs reviewed and stable Respiratory status: spontaneous breathing, nonlabored ventilation, respiratory function stable and patient connected to nasal cannula oxygen Cardiovascular status: blood pressure returned to baseline and stable Postop Assessment: no apparent nausea or vomiting Anesthetic complications: no  No notable events documented.  Last Vitals:  Vitals:   12/13/23 0945 12/13/23 1000  BP: (!) 119/46 130/71  Pulse:  71  Resp:    Temp:    SpO2:  95%    Last Pain:  Vitals:   12/13/23 0930  TempSrc:   PainSc: 0-No pain                 Shelton Silvas

## 2023-12-14 ENCOUNTER — Encounter (HOSPITAL_COMMUNITY): Payer: Self-pay | Admitting: Urology

## 2023-12-15 ENCOUNTER — Ambulatory Visit: Payer: Medicare Other | Admitting: Podiatry

## 2024-01-19 ENCOUNTER — Ambulatory Visit (INDEPENDENT_AMBULATORY_CARE_PROVIDER_SITE_OTHER): Payer: Medicare Other | Admitting: Podiatry

## 2024-01-19 ENCOUNTER — Encounter: Payer: Self-pay | Admitting: Podiatry

## 2024-01-19 DIAGNOSIS — Q828 Other specified congenital malformations of skin: Secondary | ICD-10-CM | POA: Diagnosis not present

## 2024-01-19 DIAGNOSIS — E1151 Type 2 diabetes mellitus with diabetic peripheral angiopathy without gangrene: Secondary | ICD-10-CM | POA: Diagnosis not present

## 2024-01-19 NOTE — Progress Notes (Signed)
 Subjective: Chief Complaint  Patient presents with   [facility]    RM#11 [facility] Callus right foot   78 year old female with the above concerns.  She stated the skin lesion, calluses has come back and she needs to have a trend of this causing discomfort which shoes. No open lesions or drainage.  No new concerns today.  Last A1c was 5.8 on 12/07/2023 Richmond Campbell., PA-C- last seen 12/26/2023  Objective: AAO x3, NAD DP pulses 2/4, PT pulse 1/4. Sensation intact with SWMF Hyperkeratotic lesion noted fifth metatarsal base without any underlying ulceration drainage or signs of infection.  Minimal dried blood present but this appears to be improved.   There is no surrounding erythema, ascending.  There is no drainage or pus.  No signs of infection.  Comes to the foot metatarsal base.   No pain with calf compression, swelling, warmth, erythema  Assessment: Pre-ulcerative callus right foot  Plan: -All treatment options discussed with the patient including all alternatives, risks, complications.  -Sharply debrided the hyperkeratotic lesion x 1 without any complications or bleeding. Continue moisturizer and offloading. -Patient encouraged to call the office with any questions, concerns, change in symptoms.   Return in about 3 months (around 04/17/2024) for right foot callus.  Vivi Barrack DPM

## 2024-04-19 ENCOUNTER — Encounter: Payer: Self-pay | Admitting: Podiatry

## 2024-04-19 ENCOUNTER — Ambulatory Visit (INDEPENDENT_AMBULATORY_CARE_PROVIDER_SITE_OTHER): Payer: Medicare Other | Admitting: Podiatry

## 2024-04-19 DIAGNOSIS — Q828 Other specified congenital malformations of skin: Secondary | ICD-10-CM | POA: Diagnosis not present

## 2024-04-19 DIAGNOSIS — E1151 Type 2 diabetes mellitus with diabetic peripheral angiopathy without gangrene: Secondary | ICD-10-CM | POA: Diagnosis not present

## 2024-04-19 NOTE — Progress Notes (Signed)
 Subjective: Chief Complaint  Patient presents with   Callouses    RM#13 Right foot callus trimming no pain .    78 year old female with the above concerns.  She stated the skin lesion, calluses has come back and she needs to have a trend of this causing discomfort which shoes. No open lesions or drainage.  No new concerns today.  Last A1c was 5.8 on 12/07/2023 Lory Rough., PA-C- last seen 12/26/2023  Objective: AAO x3, NAD DP pulses 2/4, PT pulse 1/4. Sensation intact with SWMF Hyperkeratotic lesion noted fifth metatarsal base without any underlying ulceration drainage or signs of infection.  There is no dried blood under the callus today.  There is no surrounding erythema, ascending.  There is no drainage or pus.  No signs of infection.  Comes to the foot metatarsal base.   No pain with calf compression, swelling, warmth, erythema  Assessment: Pre-ulcerative callus right foot  Plan: -All treatment options discussed with the patient including all alternatives, risks, complications.  -Sharply debrided the hyperkeratotic lesion x 1 without any complications or bleeding. Continue moisturizer and offloading. -Patient encouraged to call the office with any questions, concerns, change in symptoms.   Return in about 3 months (around 07/20/2024) for pre-ulceative callus.   Charity Conch DPM

## 2024-07-20 ENCOUNTER — Encounter: Payer: Self-pay | Admitting: Podiatry

## 2024-07-20 ENCOUNTER — Ambulatory Visit: Admitting: Podiatry

## 2024-07-20 DIAGNOSIS — E1151 Type 2 diabetes mellitus with diabetic peripheral angiopathy without gangrene: Secondary | ICD-10-CM | POA: Diagnosis not present

## 2024-07-20 DIAGNOSIS — Q828 Other specified congenital malformations of skin: Secondary | ICD-10-CM | POA: Diagnosis not present

## 2024-07-20 NOTE — Progress Notes (Signed)
 Subjective: Chief Complaint  Patient presents with   Callouses    pre-ulceative callus right lateral foot. 0 pain.      78 year old female with the above concerns.  She stated the skin lesion, calluses has come back and she needs to have a trimmed as it does cause discomfort as a gets thicker.  No swelling redness or drainage.  No open lesions.  Last A1c was 5.8 on June 11, 2024 Debrah Josette ORN., PA-C- last seen 07/02/2024  Objective: AAO x3, NAD DP pulses 2/4, PT pulse 1/4. Sensation intact with SWMF Hyperkeratotic lesion noted fifth metatarsal base without any underlying ulceration drainage or signs of infection.  There is no dried blood under the callus today.  No open lesions.  No pain with calf compression, swelling, warmth, erythema  Assessment: Pre-ulcerative callus right foot  Plan: -All treatment options discussed with the patient including all alternatives, risks, complications.  -Sharply debrided the hyperkeratotic lesion x 1 without any complications or bleeding. Continue moisturizer and offloading. -Patient encouraged to call the office with any questions, concerns, change in symptoms.   Return in about 3 months (around 10/20/2024) for calllus right foot.  Donnice JONELLE Fees DPM

## 2024-10-25 ENCOUNTER — Encounter: Payer: Self-pay | Admitting: Podiatry

## 2024-10-25 ENCOUNTER — Ambulatory Visit: Admitting: Podiatry

## 2024-10-25 VITALS — Ht 62.0 in | Wt 154.8 lb

## 2024-10-25 DIAGNOSIS — E1151 Type 2 diabetes mellitus with diabetic peripheral angiopathy without gangrene: Secondary | ICD-10-CM

## 2024-10-25 DIAGNOSIS — Q828 Other specified congenital malformations of skin: Secondary | ICD-10-CM | POA: Diagnosis not present

## 2024-10-25 NOTE — Progress Notes (Signed)
 Subjective: Chief Complaint  Patient presents with   Callouses    Pt is here to have callous removed, she has no other complaints.    78 year old female with the above concerns.  She stated the skin lesion, calluses has come back and she needs to have a trimmed as it does cause discomfort as a gets thicker.  No open lesions.  Last A1c was 5.8 on June 21, 2024 Debrah Josette ORN., PA-C- last seen 08/09/2024  Objective: AAO x3, NAD DP pulses 2/4, PT pulse 1/4. Sensation intact with SWMF Hyperkeratotic lesion noted fifth metatarsal base without any underlying ulceration drainage or signs of infection.  There is no dried blood under the callus today.  No open lesions.  No pain with calf compression, swelling, warmth, erythema  Assessment: Pre-ulcerative callus right foot  Plan: -All treatment options discussed with the patient including all alternatives, risks, complications.  -Sharply debrided the hyperkeratotic lesion x 1 without any complications or bleeding. Continue moisturizer and offloading. -Patient encouraged to call the office with any questions, concerns, change in symptoms.   Return in about 3 months (around 01/23/2025).  Donnice JONELLE Fees DPM

## 2024-11-27 ENCOUNTER — Other Ambulatory Visit: Payer: Self-pay | Admitting: Family Medicine

## 2024-11-27 DIAGNOSIS — Z1231 Encounter for screening mammogram for malignant neoplasm of breast: Secondary | ICD-10-CM

## 2024-12-13 ENCOUNTER — Ambulatory Visit
Admission: RE | Admit: 2024-12-13 | Discharge: 2024-12-13 | Disposition: A | Discharge status: Home / Self Care | Source: Ambulatory Visit | Attending: Family Medicine | Admitting: Family Medicine

## 2024-12-13 DIAGNOSIS — Z1231 Encounter for screening mammogram for malignant neoplasm of breast: Secondary | ICD-10-CM

## 2025-01-24 ENCOUNTER — Ambulatory Visit: Admitting: Podiatry
